# Patient Record
Sex: Male | Born: 1960 | Race: Black or African American | Hispanic: No | Marital: Married | Smoking: Never smoker
Health system: Southern US, Community
[De-identification: ages and names within clinical notes are randomized; demographics above are authoritative.]

## PROBLEM LIST (undated history)

## (undated) DIAGNOSIS — K219 Gastro-esophageal reflux disease without esophagitis: Secondary | ICD-10-CM

## (undated) DIAGNOSIS — N200 Calculus of kidney: Secondary | ICD-10-CM

## (undated) DIAGNOSIS — G4730 Sleep apnea, unspecified: Secondary | ICD-10-CM

## (undated) DIAGNOSIS — I4891 Unspecified atrial fibrillation: Secondary | ICD-10-CM

## (undated) DIAGNOSIS — I10 Essential (primary) hypertension: Secondary | ICD-10-CM

## (undated) HISTORY — DX: Gastro-esophageal reflux disease without esophagitis: K21.9

## (undated) HISTORY — DX: Unspecified atrial fibrillation: I48.91

## (undated) HISTORY — PX: APPENDECTOMY: SHX54

## (undated) HISTORY — DX: Calculus of kidney: N20.0

## (undated) HISTORY — DX: Sleep apnea, unspecified: G47.30

---

## 2005-12-19 ENCOUNTER — Emergency Department (HOSPITAL_COMMUNITY): Admission: EM | Admit: 2005-12-19 | Discharge: 2005-12-19 | Payer: Self-pay | Admitting: Emergency Medicine

## 2008-04-28 ENCOUNTER — Ambulatory Visit: Payer: Self-pay | Admitting: Cardiology

## 2008-04-29 ENCOUNTER — Inpatient Hospital Stay (HOSPITAL_COMMUNITY): Admission: AD | Admit: 2008-04-29 | Discharge: 2008-05-01 | Payer: Self-pay | Admitting: Cardiology

## 2008-04-29 ENCOUNTER — Ambulatory Visit: Payer: Self-pay | Admitting: Internal Medicine

## 2010-12-07 NOTE — Discharge Summary (Signed)
[name], [name]                ACCOUNT NO.:  [id number]   MEDICAL RECORD NO.:  [id number]          PATIENT TYPE:  INP   LOCATION:  2035                         FACILITY:  [facility]   PHYSICIAN:  Joseph Fells. Excell Seltzer, Joseph Armstrong  DATE OF BIRTH:  [birth year]   DATE OF ADMISSION:  04/29/2008  DATE OF DISCHARGE:  05/01/2008                               DISCHARGE SUMMARY   DISCHARGE DIAGNOSES:  1. Chest pain an dyspnea, noncardiac in etiology, status post cardiac      catheterization showed normal coronaries with low normal ejection      fraction and high normal pulmonary artery pressures.  2. Morbid obesity.  3. Hypertension.  4. Dyslipidemia.   PROCEDURES THIS ADMISSION:  Cardiac catheterization.   HOSPITAL HISTORY:  [name] is a 50 year old gentleman with history of  hypertension and morbid obesity as well as obstructive sleep apnea, who  was initially seen at [facility] with complaints of palpitations,  progressive shortness of breath, and chest discomfort with abnormal  stress Myoview.  The patient was transferred to [facility] for further  evaluation, underwent cardiac catheterization on April 30, 2008,  results as stated above.  Dr. Excell Armstrong in to see the patient on day of  discharge.  BUN and creatinine 9 and 1.2, potassium 4.2, hematocrit  37.8, cath site stable at right groin.  The patient being discharged  home to follow up with Dr. Doyne Keel, his primary care physician.  Will  have a post cath check in our office in [place] on May 19, 2008, at  2:30.  He will need fasting lipid and liver in 4-6 weeks.   DISCHARGE MEDICATIONS:  At time of discharge, he is instructed to  continue his:  1. Avapro 300 mg daily.  2. Aspirin 325 daily.  3. Simvastatin 40 daily has been prescribed.  4. Carvedilol 3.125 mg b.i.d. has been prescribed.   The patient is given the post cardiac catheterization discharge  instructions.   DURATION OF DISCHARGE ENCOUNTER:  Greater than 30  minutes.      Joseph Armstrong, Joseph Armstrong      Joseph Fells. Excell Seltzer, Joseph Armstrong  Electronically Signed   MB/MEDQ  D:  05/01/2008  T:  05/01/2008  Job:  [id number]

## 2010-12-07 NOTE — Cardiovascular Report (Signed)
NAMEJOSHIAH, Joseph Armstrong                ACCOUNT NO.:  [id number]   MEDICAL RECORD NO.:  [id number]          PATIENT TYPE:  INP   LOCATION:  2035                         FACILITY:  [facility]   PHYSICIAN:  Bevelyn Buckles. Bensimhon, MDDATE OF BIRTH:  1960-08-24   DATE OF PROCEDURE:  04/30/2008  DATE OF DISCHARGE:                            CARDIAC CATHETERIZATION   PATIENT IDENTIFICATION:  Joseph Armstrong is a very pleasant 50 year old male  with history of morbid obesity, hypertension and obstructive sleep  apnea.  He was admitted to [facility] with chest tightness and  significant dyspnea.  He had a Myoview which showed an ejection fraction  of 40%.  No clear perfusion defects.  He was referred for  catheterization to evaluate his abnormal stress test.   PROCEDURES PERFORMED:  1. Right heart cath.  2. Left heart cath.  3. Left ventriculogram.  4. Selective coronary angiography.  5. Angio-Seal femoral artery closure (failed).   DESCRIPTION OF PROCEDURE:  The risks and indications were explained,  consent was signed and placed on the chart.  The right groin area was  prepped and draped in routine sterile fashion.  We had a significant  amount of difficulty obtaining vascular access due to the patient's  size.  We finally ended up using a long needle and were able to get into  the femoral artery.  We placed a 6-French arterial sheath there.  The  left heart cath was performed using standard catheters, including a JL-  4, JR-4 and an angled pigtail.  There are no apparent complications.  We  attempted to close the artery with an Angio-Seal closure device, but  this failed and we held manual pressure for 10-15 minutes with good  hemostasis.  We then turned our attention to the femoral vein and were  able to cannulate this once again with a long needle.  A 7-French venous  sheath was placed in the right femoral vein and a standard Swan-Ganz  catheter was used for the right heart  catheterization.   HEMODYNAMICS:  Central aortic pressure was 177/112 with mean of 136.  LV  pressure 188/80 with an EDP of 11.  There was no aortic stenosis.  Right  atrial pressure mean of 11, RV pressure 155/12 with an EDP of 11, PA  pressure 40/16 with a mean of 25.  Pulmonary capillary wedge pressure  was mean of 14.   Left main was normal.   The LAD was a long vessel coursing to the apex, gave off three  diagonals, was angiographically normal.   The left circumflex gave off a large ramus, a large OM, and a moderate-  sized OM-2, was angiographically normal.   The right coronary artery was a moderate sized dominant vessel that gave  off an RV branch, PDA and a posterolateral, was angiographically normal.   Left ventriculogram done in the RAO position showed an ejection fraction  of approximately 50% with no regional wall motion abnormalities.   ASSESSMENT:  1. Normal coronary arteries.  2. Low normal left ventricular ejection fraction of 50% with no      regional  wall motion abnormalities.  3. High normal pulmonary artery pressures.   PLAN/DISCUSSION:  [name]' chest pain is likely noncardiac.  I  suspect his dyspnea is most likely due to his body habitus.  He will be  able to be discharged home in the morning if his groin and blood counts  are stable.      Bevelyn Buckles. Bensimhon, MD  Electronically Signed     DRB/MEDQ  D:  04/30/2008  T:  04/30/2008  Job:  [id number]

## 2011-04-25 LAB — POCT I-STAT 3, VENOUS BLOOD GAS (G3P V)
Bicarbonate: 24.5 — ABNORMAL HIGH
O2 Saturation: 58
O2 Saturation: 61
TCO2: 24
TCO2: 26
pCO2, Ven: 45.9
pH, Ven: 7.335 — ABNORMAL HIGH
pO2, Ven: 33
pO2, Ven: 33

## 2011-04-25 LAB — COMPREHENSIVE METABOLIC PANEL
ALT: 25
AST: 21
GFR calc Af Amer: >60
Glucose, Bld: 106 — ABNORMAL HIGH
Potassium: 4.2
Total Bilirubin: 0.6

## 2011-04-25 LAB — POCT I-STAT 3, ART BLOOD GAS (G3+)
TCO2: 26
pCO2 arterial: 42.5
pH, Arterial: 7.373

## 2011-04-25 LAB — HEMOGLOBIN A1C
Hgb A1c MFr Bld: 6.2 — ABNORMAL HIGH
Mean Plasma Glucose: 131

## 2011-04-25 LAB — CBC
HCT: 37.8 — ABNORMAL LOW
Hemoglobin: 12.1 — ABNORMAL LOW
MCV: 79.1
Platelets: 194
RBC: 4.78
RDW: 16.8 — ABNORMAL HIGH
WBC: 7.6

## 2011-04-25 LAB — APTT: aPTT: 35

## 2014-02-16 ENCOUNTER — Encounter (HOSPITAL_COMMUNITY): Payer: Self-pay | Admitting: Emergency Medicine

## 2014-02-16 ENCOUNTER — Emergency Department (HOSPITAL_COMMUNITY)
Admission: EM | Admit: 2014-02-16 | Discharge: 2014-02-17 | Discharge status: Against Medical Advice | Payer: 59 | Attending: Emergency Medicine | Admitting: Emergency Medicine

## 2014-02-16 DIAGNOSIS — I10 Essential (primary) hypertension: Secondary | ICD-10-CM | POA: Insufficient documentation

## 2014-02-16 DIAGNOSIS — R079 Chest pain, unspecified: Secondary | ICD-10-CM

## 2014-02-16 DIAGNOSIS — E785 Hyperlipidemia, unspecified: Secondary | ICD-10-CM | POA: Insufficient documentation

## 2014-02-16 DIAGNOSIS — R072 Precordial pain: Secondary | ICD-10-CM | POA: Diagnosis present

## 2014-02-16 DIAGNOSIS — I169 Hypertensive crisis, unspecified: Secondary | ICD-10-CM

## 2014-02-16 DIAGNOSIS — R000 Tachycardia, unspecified: Secondary | ICD-10-CM | POA: Diagnosis not present

## 2014-02-16 HISTORY — DX: Essential (primary) hypertension: I10

## 2014-02-16 MED ORDER — DILTIAZEM LOAD VIA INFUSION
10.0000 mg | Freq: Once | INTRAVENOUS | Status: AC
  Administered 2014-02-17: 10 mg via INTRAVENOUS
  Filled 2014-02-16: qty 10

## 2014-02-16 MED ORDER — DILTIAZEM HCL 100 MG IV SOLR
5.0000 mg/h | INTRAVENOUS | Status: DC
  Administered 2014-02-17: 5 mg/h via INTRAVENOUS

## 2014-02-16 NOTE — ED Notes (Signed)
Per  EMS pt had alcoholic beverage with wife and begin having chest pain(discomfort) radiating into shoulders and throat; Pt denies SOB, n/v. Pt states tightness in chest and throat. Pt took 325 mg of Asprin at home. Pt has urinated 3 x in last 45 minutes, states not normal; Pt has hx of HTN, hyperlipidemia; Pt is obese; Pt abmultory and a&o x 4; Pt states he missed yesterday's dose of metoprolol. Pt currently in A-Flutter which is new onset. Md at bedside for assessment

## 2014-02-17 ENCOUNTER — Emergency Department (HOSPITAL_COMMUNITY): Payer: 59

## 2014-02-17 ENCOUNTER — Encounter (HOSPITAL_COMMUNITY): Payer: Self-pay | Admitting: Emergency Medicine

## 2014-02-17 LAB — CBC WITH DIFFERENTIAL/PLATELET
BASOS ABS: 0 10*3/uL (ref 0.0–0.1)
BASOS PCT: 0 % (ref 0–1)
EOS PCT: 4 % (ref 0–5)
Eosinophils Absolute: 0.3 10*3/uL (ref 0.0–0.7)
HEMATOCRIT: 45.1 % (ref 39.0–52.0)
HEMOGLOBIN: 14.3 g/dL (ref 13.0–17.0)
LYMPHS PCT: 21 % (ref 12–46)
Lymphs Abs: 1.5 10*3/uL (ref 0.7–4.0)
MCH: 25.6 pg — AB (ref 26.0–34.0)
MCHC: 31.7 g/dL (ref 30.0–36.0)
MCV: 80.7 fL (ref 78.0–100.0)
MONO ABS: 0.5 10*3/uL (ref 0.1–1.0)
Monocytes Relative: 6 % (ref 3–12)
NEUTROS ABS: 4.9 10*3/uL (ref 1.7–7.7)
NEUTROS PCT: 69 % (ref 43–77)
Platelets: 186 10*3/uL (ref 150–400)
RBC: 5.59 MIL/uL (ref 4.22–5.81)
RDW: 16.2 % — AB (ref 11.5–15.5)
WBC: 7.1 10*3/uL (ref 4.0–10.5)

## 2014-02-17 LAB — PRO B NATRIURETIC PEPTIDE: Pro B Natriuretic peptide (BNP): 310.2 pg/mL — ABNORMAL HIGH (ref 0–125)

## 2014-02-17 LAB — I-STAT CHEM 8, ED
BUN: 16 mg/dL (ref 6–23)
Calcium, Ion: 1.15 mmol/L (ref 1.12–1.23)
Chloride: 105 mEq/L (ref 96–112)
Creatinine, Ser: 1.3 mg/dL (ref 0.50–1.35)
Glucose, Bld: 117 mg/dL — ABNORMAL HIGH (ref 70–99)
HCT: 47 % (ref 39.0–52.0)
Hemoglobin: 16 g/dL (ref 13.0–17.0)
Potassium: 3.6 mEq/L — ABNORMAL LOW (ref 3.7–5.3)
SODIUM: 143 meq/L (ref 137–147)
TCO2: 25 mmol/L (ref 0–100)

## 2014-02-17 LAB — RAPID URINE DRUG SCREEN, HOSP PERFORMED
Amphetamines: NOT DETECTED
Barbiturates: NOT DETECTED
Benzodiazepines: NOT DETECTED
COCAINE: NOT DETECTED
Opiates: NOT DETECTED
Tetrahydrocannabinol: NOT DETECTED

## 2014-02-17 LAB — I-STAT TROPONIN, ED: Troponin i, poc: 0.01 ng/mL (ref 0.00–0.08)

## 2014-02-17 MED ORDER — ASPIRIN 81 MG PO CHEW
324.0000 mg | CHEWABLE_TABLET | Freq: Once | ORAL | Status: DC
  Filled 2014-02-17: qty 4

## 2014-02-17 MED ORDER — NITROGLYCERIN 2 % TD OINT
0.5000 [in_us] | TOPICAL_OINTMENT | Freq: Once | TRANSDERMAL | Status: DC
  Filled 2014-02-17: qty 1

## 2014-02-17 MED ORDER — HYDRALAZINE HCL 20 MG/ML IJ SOLN
5.0000 mg | Freq: Once | INTRAMUSCULAR | Status: AC
  Administered 2014-02-17: 5 mg via INTRAVENOUS
  Filled 2014-02-17: qty 1

## 2014-02-17 NOTE — ED Provider Notes (Signed)
CSN: [id number]     Arrival date & time 02/16/14  2334 History   First MD Initiated Contact with Patient 02/16/14 2339     Chief Complaint  Patient presents with  . Chest Pain     (Consider location/radiation/quality/duration/timing/severity/associated sxs/prior Treatment) Patient is a 53 y.o. male presenting with chest pain. The history is provided by the patient.  Chest Pain Pain location:  Substernal area Pain quality: tightness   Pain radiates to:  Neck Pain radiates to the back: no   Pain severity:  Severe Onset quality:  Sudden Timing:  Constant Progression:  Unchanged Chronicity:  New Context: at rest   Context comment:  Alcohol Relieved by:  Nothing Worsened by:  Nothing tried Ineffective treatments:  None tried Associated symptoms: no abdominal pain, no cough and no fever   Risk factors: hypertension   Risk factors: no aortic disease and no smoking     Past Medical History  Diagnosis Date  . Hypertension   . Hyperlipemia    History reviewed. No pertinent past surgical history. History reviewed. No pertinent family history. History  Substance Use Topics  . Smoking status: Never Smoker   . Smokeless tobacco: Never Used  . Alcohol Use: Yes    Review of Systems  Constitutional: Negative for fever.  Respiratory: Negative for cough and wheezing.   Cardiovascular: Positive for chest pain.  Gastrointestinal: Negative for abdominal pain.  All other systems reviewed and are negative.     Allergies  Review of patient's allergies indicates not on file.  Home Medications   Prior to Admission medications   Not on File   SpO2 98% Physical Exam  Constitutional: He is oriented to person, place, and time. He appears well-developed and well-nourished.  HENT:  Head: Normocephalic and atraumatic.  Mouth/Throat: Oropharynx is clear and moist.  Eyes: Conjunctivae are normal. Pupils are equal, round, and reactive to light.  Neck: Normal range of motion. Neck  supple.  Cardiovascular: Regular rhythm.  Tachycardia present.   Pulmonary/Chest: Effort normal and breath sounds normal. No respiratory distress. He has no wheezes. He has no rales.  Abdominal: Soft. Bowel sounds are normal. There is no tenderness. There is no rebound and no guarding.  Musculoskeletal: Normal range of motion. He exhibits no tenderness.  Neurological: He is alert and oriented to person, place, and time.  Skin: Skin is warm and dry. He is not diaphoretic.    ED Course  Procedures (including critical care time) Labs Review Labs Reviewed  CBC WITH DIFFERENTIAL  PRO B NATRIURETIC PEPTIDE  URINE RAPID DRUG SCREEN (HOSP PERFORMED)  I-STAT CHEM 8, ED  I-STAT TROPOININ, ED    Imaging Review No results found.   EKG Interpretation None      MDM   Final diagnoses:  None     Date: 02/17/2014  Rate: 146  Rhythm: sinus tachycardia  QRS Axis: normal  Intervals: normal  ST/T Wave abnormalities: nonspecific ST changes  Conduction Disutrbances:none  Narrative Interpretation:   Old EKG Reviewed: none available   Medications  diltiazem (CARDIZEM) 1 mg/mL load via infusion 10 mg (0 mg Intravenous Stopped 02/17/14 0015)    And  diltiazem (CARDIZEM) 100 mg in dextrose 5 % 100 mL infusion (0 mg/hr Intravenous Hold 02/17/14 0158)  aspirin chewable tablet 324 mg (324 mg Oral Not Given 02/17/14 0014)  nitroGLYCERIN (NITROGLYN) 2 % ointment 0.5 inch ( Topical Not Given 02/17/14 0129)  hydrALAZINE (APRESOLINE) injection 5 mg (5 mg Intravenous Given 02/17/14 0043)   MDM  Reviewed: nursing note and vitals Interpretation: labs, ECG and x-ray (NACPD) Total time providing critical care: 30-74 minutes. This excludes time spent performing separately reportable procedures and services. Consults: admitting MD  CRITICAL CARE Performed by: Carlisle Beers Total critical care time: 31 minutes Critical care time was exclusive of separately billable procedures and treating other  patients. Critical care was necessary to treat or prevent imminent or life-threatening deterioration. Critical care was time spent personally by me on the following activities: development of treatment plan with patient and/or surrogate as well as nursing, discussions with consultants, evaluation of patient's response to treatment, examination of patient, obtaining history from patient or surrogate, ordering and performing treatments and interventions, ordering and review of laboratory studies, ordering and review of radiographic studies, pulse oximetry and re-evaluation of patient's condition.   Carlisle Beers, MD 02/17/14 [phone]

## 2014-02-17 NOTE — ED Provider Notes (Signed)
After admitting patient has decided to leave AMA.  He has decision making capacity to refuse care.  Understands that the risks of leaving are but are not limited to: Death, MI, CVA, prolonged morbidity.  Patient understands these risks and signs AMA and is welcome to return at any time  Kori Goins Alfonso Patten, MD 02/17/14 [phone]

## 2014-02-17 NOTE — ED Notes (Signed)
Risks of Leaving AMA explained to the pt, informed to come back if his condition worsen.

## 2015-02-05 ENCOUNTER — Ambulatory Visit (INDEPENDENT_AMBULATORY_CARE_PROVIDER_SITE_OTHER): Payer: 59 | Admitting: Primary Care

## 2015-02-05 ENCOUNTER — Encounter: Payer: Self-pay | Admitting: *Deleted

## 2015-02-05 ENCOUNTER — Encounter: Payer: Self-pay | Admitting: Primary Care

## 2015-02-05 VITALS — BP 156/106 | HR 65 | Temp 98.3°F | Ht 70.0 in | Wt 383.8 lb

## 2015-02-05 DIAGNOSIS — I152 Hypertension secondary to endocrine disorders: Secondary | ICD-10-CM | POA: Insufficient documentation

## 2015-02-05 DIAGNOSIS — G4733 Obstructive sleep apnea (adult) (pediatric): Secondary | ICD-10-CM | POA: Insufficient documentation

## 2015-02-05 DIAGNOSIS — E538 Deficiency of other specified B group vitamins: Secondary | ICD-10-CM | POA: Insufficient documentation

## 2015-02-05 DIAGNOSIS — I10 Essential (primary) hypertension: Secondary | ICD-10-CM

## 2015-02-05 DIAGNOSIS — E1159 Type 2 diabetes mellitus with other circulatory complications: Secondary | ICD-10-CM | POA: Insufficient documentation

## 2015-02-05 LAB — VITAMIN B12: Vitamin B-12: 190 pg/mL — ABNORMAL LOW (ref 211–911)

## 2015-02-05 MED ORDER — VALSARTAN-HYDROCHLOROTHIAZIDE 320-25 MG PO TABS: 1.0000 | ORAL_TABLET | Freq: Every day | ORAL | Status: DC

## 2015-02-05 MED ORDER — METOPROLOL TARTRATE 50 MG PO TABS: 50.0000 mg | ORAL_TABLET | Freq: Two times a day (BID) | ORAL | Status: DC

## 2015-02-05 NOTE — Progress Notes (Signed)
Subjective:    Patient ID: Joseph Armstrong, male    DOB: July 10, 1961, 54 y.o.   MRN: [id number]  HPI  Joseph Armstrong is a 54 year old male who presents today to establish care and discuss the problems mentioned below. Will obtain old records.  1) Hypertension: Managed on valsartan-HCTZ 320/25 mg and lopressor 50 mg tablets. Diagnosed with hypertension 25 years ago. He checks his blood pressure once every month. He's been out of his medication for the past 30 days due to his previous PCP retiring. Denies headaches, chest pain, shortness of breath.   2) Vitamin B12 def: He's been taking the cyanocobalamin injections for about 8 months. His levels were checked about 3 months ago and does not recall the latest reading.  3) OSA: Diagnosed 5-6 years ago and sleeps with CPAP. He is due for re-calibration.  4) Obesity: Breakfast: eggs, [place] bacon, orange juice. Lunch: chicken and fish (fried, baked). Dinner: chicken and fish (fried and baked) greens, cabbage, corn. Drinks mostly water, some kool-aid. Limited sodas and sweet tea. He does not currently exercise.  Review of Systems  HENT: Negative for rhinorrhea.   Respiratory: Negative for cough and shortness of breath.   Cardiovascular: Negative for chest pain.  Gastrointestinal: Negative for diarrhea and constipation.  Genitourinary: Negative for difficulty urinating.  Musculoskeletal: Negative for myalgias and arthralgias.  Skin: Negative for rash.  Neurological: Negative for dizziness and headaches.  Psychiatric/Behavioral:       Denies concerns for anxiety or depression       Past Medical History  Diagnosis Date  . Hypertension   . Kidney stones     History   Social History  . Marital Status: Married    Spouse Name: N/A  . Number of Children: N/A  . Years of Education: N/A   Occupational History  . Not on file.   Social History Main Topics  . Smoking status: Never Smoker   . Smokeless tobacco: Never Used  . Alcohol Use: 0.0  oz/week    0 Standard drinks or equivalent per week  . Drug Use: No  . Sexual Activity: Not on file   Other Topics Concern  . Not on file   Social History Narrative   Married.    2 children.   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     Past Surgical History  Procedure Laterality Date  . Appendectomy      Family History  Problem Relation Age of Onset  . Stroke Father   . Hypertension Father   . Hypertension Mother   . Diabetes Maternal Grandfather     No Known Allergies  Current Outpatient Prescriptions on File Prior to Visit  Medication Sig Dispense Refill  . Cyanocobalamin (VITAMIN B-12 IJ) Inject 1 Applicatorful as directed every 30 (thirty) days.     No current facility-administered medications on file prior to visit.    BP 156/106 mmHg  Pulse 65  Temp(Src) 98.3 F (36.8 C) (Oral)  Ht 5\' 10"  (1.778 m)  Wt 383 lb 12.8 oz (174.091 kg)  BMI 55.07 kg/m2  SpO2 98%    Objective:   Physical Exam  Constitutional: He is oriented to person, place, and time. He appears well-nourished.  HENT:  Head: Normocephalic.  Neck: Neck supple.  Cardiovascular: Normal rate and regular rhythm.   Pulmonary/Chest: Effort normal and breath sounds normal.  Neurological: He is alert and oriented to person, place, and time.  Skin: Skin is warm and dry.  Psychiatric: He has a normal mood and affect.          Assessment & Plan:

## 2015-02-05 NOTE — Assessment & Plan Note (Signed)
Diagnosed 25 years ago. Managed on diovan hct and metoprolol for years. He's been out for the past 30 days and is hypertensive in office. Refills provided. Will follow up in 1 month.

## 2015-02-05 NOTE — Patient Instructions (Addendum)
Refills have been provided for your blood pressure medication.  Complete lab work prior to leaving today. I will notify you of your results.  Please schedule a physical with me in the next 4 weeks. You will also schedule a lab only appointment one week prior. We will discuss your lab results during your physical.  You will be contacted regarding your referral to Pulmonology for re-calibration of your CPAP.  Please let us know if you have not heard back within one week.   It was a pleasure to meet you today! Please don't hesitate to call me with any questions. Welcome to [facility]!

## 2015-02-05 NOTE — Assessment & Plan Note (Signed)
On vitamin B12 injections for past 8 months. Unsure of recent levels. Lab today for levels. If remains low then will resume.

## 2015-02-05 NOTE — Assessment & Plan Note (Signed)
Wears Cpap. Due for re-calibration. Referral made to pulmonology.

## 2015-02-11 ENCOUNTER — Encounter: Payer: Self-pay | Admitting: *Deleted

## 2015-02-25 ENCOUNTER — Other Ambulatory Visit: Payer: Self-pay | Admitting: Primary Care

## 2015-02-25 DIAGNOSIS — Z0000 Encounter for general adult medical examination without abnormal findings: Secondary | ICD-10-CM

## 2015-03-02 ENCOUNTER — Other Ambulatory Visit (INDEPENDENT_AMBULATORY_CARE_PROVIDER_SITE_OTHER): Payer: 59

## 2015-03-02 DIAGNOSIS — Z125 Encounter for screening for malignant neoplasm of prostate: Secondary | ICD-10-CM | POA: Diagnosis not present

## 2015-03-02 DIAGNOSIS — Z0000 Encounter for general adult medical examination without abnormal findings: Secondary | ICD-10-CM

## 2015-03-02 DIAGNOSIS — I10 Essential (primary) hypertension: Secondary | ICD-10-CM | POA: Diagnosis not present

## 2015-03-02 LAB — COMPREHENSIVE METABOLIC PANEL
ALT: 16 U/L (ref 0–53)
AST: 16 U/L (ref 0–37)
Albumin: 4.1 g/dL (ref 3.5–5.2)
Alkaline Phosphatase: 51 U/L (ref 39–117)
BUN: 15 mg/dL (ref 6–23)
CALCIUM: 9.5 mg/dL (ref 8.4–10.5)
CO2: 29 mEq/L (ref 19–32)
CREATININE: 1.26 mg/dL (ref 0.40–1.50)
Chloride: 103 mEq/L (ref 96–112)
GFR: 76.56 mL/min (ref >60.00)
GLUCOSE: 114 mg/dL — AB (ref 70–99)
Potassium: 4.7 mEq/L (ref 3.5–5.1)
Sodium: 139 mEq/L (ref 135–145)
Total Bilirubin: 0.4 mg/dL (ref 0.2–1.2)
Total Protein: 7.4 g/dL (ref 6.0–8.3)

## 2015-03-02 LAB — LIPID PANEL
Cholesterol: 171 mg/dL (ref 0–200)
HDL: 43.5 mg/dL (ref >39.00)
LDL CALC: 115 mg/dL — AB (ref 0–99)
NonHDL: 127.57
Total CHOL/HDL Ratio: 4
Triglycerides: 64 mg/dL (ref 0.0–149.0)
VLDL: 12.8 mg/dL (ref 0.0–40.0)

## 2015-03-02 LAB — CBC
HCT: 44.7 % (ref 39.0–52.0)
HEMOGLOBIN: 14.4 g/dL (ref 13.0–17.0)
MCHC: 32.2 g/dL (ref 30.0–36.0)
MCV: 79.7 fl (ref 78.0–100.0)
Platelets: 206 10*3/uL (ref 150.0–400.0)
RBC: 5.61 Mil/uL (ref 4.22–5.81)
RDW: 16.9 % — ABNORMAL HIGH (ref 11.5–15.5)
WBC: 7.2 10*3/uL (ref 4.0–10.5)

## 2015-03-02 LAB — HEMOGLOBIN A1C: Hgb A1c MFr Bld: 6 % (ref 4.6–6.5)

## 2015-03-02 LAB — PSA: PSA: 0.46 ng/mL (ref 0.10–4.00)

## 2015-03-09 ENCOUNTER — Encounter: Payer: 59 | Admitting: Primary Care

## 2015-03-10 ENCOUNTER — Encounter: Payer: Self-pay | Admitting: Primary Care

## 2015-03-10 ENCOUNTER — Ambulatory Visit (INDEPENDENT_AMBULATORY_CARE_PROVIDER_SITE_OTHER): Payer: 59 | Admitting: Primary Care

## 2015-03-10 VITALS — Ht 70.0 in | Wt 380.8 lb

## 2015-03-10 DIAGNOSIS — Z23 Encounter for immunization: Secondary | ICD-10-CM

## 2015-03-10 DIAGNOSIS — I10 Essential (primary) hypertension: Secondary | ICD-10-CM | POA: Diagnosis not present

## 2015-03-10 DIAGNOSIS — Z1211 Encounter for screening for malignant neoplasm of colon: Secondary | ICD-10-CM

## 2015-03-10 DIAGNOSIS — Z0000 Encounter for general adult medical examination without abnormal findings: Secondary | ICD-10-CM | POA: Diagnosis not present

## 2015-03-10 DIAGNOSIS — E538 Deficiency of other specified B group vitamins: Secondary | ICD-10-CM | POA: Diagnosis not present

## 2015-03-10 DIAGNOSIS — I4891 Unspecified atrial fibrillation: Secondary | ICD-10-CM | POA: Diagnosis not present

## 2015-03-10 DIAGNOSIS — Z6841 Body Mass Index (BMI) 40.0 and over, adult: Secondary | ICD-10-CM

## 2015-03-10 MED ORDER — LOSARTAN POTASSIUM-HCTZ 100-25 MG PO TABS: 1.0000 | ORAL_TABLET | Freq: Every day | ORAL | Status: DC

## 2015-03-10 MED ORDER — CYANOCOBALAMIN 1000 MCG/ML IJ SOLN
1000.0000 ug | Freq: Once | INTRAMUSCULAR | Status: AC
  Administered 2015-03-10: 1000 ug via INTRAMUSCULAR

## 2015-03-10 NOTE — Assessment & Plan Note (Addendum)
Today he endorses history of after I noted an irregularity during exam. Rate regularly irregular, almost representing PVC's. ECG with normal sinus rhythm at a rate of 60. Asymptomatic during exam, will continue to monitor.

## 2015-03-10 NOTE — Progress Notes (Signed)
Pre visit review using our clinic review tool, if applicable. No additional management support is needed unless otherwise documented below in the visit note. 

## 2015-03-10 NOTE — Progress Notes (Signed)
Subjective:    Patient ID: Joseph Armstrong, male    DOB: Feb 28, 1961, 54 y.o.   MRN: [id number]  HPI  Joseph Armstrong is a 54 year old male who presents today for complete physical.  Immunizations: -Tetanus: Has not completed in over 10 years -Influenza: Did not receive last season   Diet:  Breakfast: [place] bacon, eggs, orange juice Lunch: Fried and baked fish and chicken Dinner: Fried and baked fish and chicken, vegetables, rice Desserts: Does not eat Beverages: Water  Exercise: He does not currently exercise Eye exam: Completed 2 months ago, was prescribed glasses Dental exam: Has not completed in over several years Colonoscopy: Has never completed.  Wt Readings from Last 3 Encounters:  03/10/15 380 lb 12.8 oz (172.73 kg)  02/05/15 383 lb 12.8 oz (174.091 kg)  02/17/14 391 lb 1 oz (177.385 kg)   2) Essential hypertension: He is managed on metoprolol 50 mg BID and valsartan-hctz 320/25 mg. BP today 146/92, but has not had his medication. He would like to change his valsartan-hctz to another medication as he has difficulty swallowing his large pills.   BP Readings from Last 3 Encounters:  02/05/15 156/106  02/17/14 151/64     Review of Systems  Constitutional: Negative for unexpected weight change.  HENT: Negative for rhinorrhea.   Respiratory: Negative for cough and shortness of breath.   Cardiovascular: Negative for chest pain.  Gastrointestinal: Negative for diarrhea, constipation and blood in stool.  Genitourinary: Negative for difficulty urinating.  Musculoskeletal: Negative for myalgias and arthralgias.  Skin: Negative for rash.  Neurological: Negative for dizziness, numbness and headaches.  Psychiatric/Behavioral:       Denies concerns for anxiety or depression       Past Medical History  Diagnosis Date  . Hypertension   . Kidney stones   . Atrial fibrillation     Social History   Social History  . Marital Status: Married    Spouse Name: N/A  . Number  of Children: N/A  . Years of Education: N/A   Occupational History  . Not on file.   Social History Main Topics  . Smoking status: Never Smoker   . Smokeless tobacco: Never Used  . Alcohol Use: 0.0 oz/week    0 Standard drinks or equivalent per week  . Drug Use: No  . Sexual Activity: Not on file   Other Topics Concern  . Not on file   Social History Narrative   Married.    2 children.   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     Past Surgical History  Procedure Laterality Date  . Appendectomy      Family History  Problem Relation Age of Onset  . Stroke Father   . Hypertension Father   . Hypertension Mother   . Diabetes Maternal Grandfather     No Known Allergies  Current Outpatient Prescriptions on File Prior to Visit  Medication Sig Dispense Refill  . metoprolol (LOPRESSOR) 50 MG tablet Take 1 tablet (50 mg total) by mouth 2 (two) times daily. 60 tablet 5  . valsartan-hydrochlorothiazide (DIOVAN-HCT) 320-25 MG per tablet Take 1 tablet by mouth daily. 30 tablet 5  . Cyanocobalamin (VITAMIN B-12 IJ) Inject 1 Applicatorful as directed every 30 (thirty) days.     No current facility-administered medications on file prior to visit.    Ht 5\' 10"  (1.778 m)  Wt 380 lb 12.8 oz (172.73 kg)  BMI 54.64  kg/m2    Objective:   Physical Exam  Constitutional: He is oriented to person, place, and time. He appears well-nourished.  HENT:  Nose: Nose normal.  Mouth/Throat: Oropharynx is clear and moist.  Eyes: Conjunctivae and EOM are normal. Pupils are equal, round, and reactive to light.  Neck: Neck supple.  Cardiovascular: Normal rate.   Noted irregular rhythm upon exam. Moreso like PVC's as this was regularly irregular  Pulmonary/Chest: Effort normal and breath sounds normal.  Abdominal: Soft. Bowel sounds are normal. There is no tenderness.  Musculoskeletal: Normal range of motion.  Lymphadenopathy:    He has  no cervical adenopathy.  Neurological: He is alert and oriented to person, place, and time. He has normal reflexes. No cranial nerve deficit.  Skin: Skin is warm and dry.  Psychiatric: He has a normal mood and affect.          Assessment & Plan:

## 2015-03-10 NOTE — Assessment & Plan Note (Signed)
Discussed the importance of weight loss on overall health. Information provided regarding a healthy diet and well as exercise goals. Will continue to monitor.

## 2015-03-10 NOTE — Assessment & Plan Note (Addendum)
Tdap provided today. Referral made for colonoscopy. Irregular heart beat noted upon exam, more so representing PVC's than a-fib; otherwise exam unremarkable. Discussed the importance of healthy diet and exercise to prevent further co-morbidities. Will repeat A1C in 3 months as it is borderline. Will continue to watch cholesterol.

## 2015-03-10 NOTE — Assessment & Plan Note (Signed)
Elevated in office today, has not had meds today. Will switch from diovan to hyzaar as the diovan tablets are too large for him to swallow. Will continue to watch BP

## 2015-03-10 NOTE — Assessment & Plan Note (Signed)
Injection provided today.

## 2015-03-10 NOTE — Patient Instructions (Addendum)
Start losartan-hydrochlorothiazide (Hyzaar) 100mg /25mg  for blood pressure. Take 1 tablet by mouth once daily.  Stop taking the valsartan-hydrochlorothiazide.  You will be contacted regarding your referral to GI for your colonoscopy.  Please let us know if you have not heard back within one week.   You are borderline diabetic. It is important that you improve your diet. Please limit carbohydrates in the form of white bread, rice, pasta, cakes, cookies, sugary drinks, etc. Increase your consumption of fresh fruits and vegetables. Be sure to drink plenty of water daily.  Your ECG (picture of your heart) looks good. We will continue to watch your heart.  Follow up in 3 months for repeat of your diabetes test.  It was a pleasure to see you today!  Diabetes Mellitus and Food It is important for you to manage your blood sugar (glucose) level. Your blood glucose level can be greatly affected by what you eat. Eating healthier foods in the appropriate amounts throughout the day at about the same time each day will help you control your blood glucose level. It can also help slow or prevent worsening of your diabetes mellitus. Healthy eating may even help you improve the level of your blood pressure and reach or maintain a healthy weight.  HOW CAN FOOD AFFECT ME? Carbohydrates Carbohydrates affect your blood glucose level more than any other type of food. Your dietitian will help you determine how many carbohydrates to eat at each meal and teach you how to count carbohydrates. Counting carbohydrates is important to keep your blood glucose at a healthy level, especially if you are using insulin or taking certain medicines for diabetes mellitus. Alcohol Alcohol can cause sudden decreases in blood glucose (hypoglycemia), especially if you use insulin or take certain medicines for diabetes mellitus. Hypoglycemia can be a life-threatening condition. Symptoms of hypoglycemia (sleepiness, dizziness, and  disorientation) are similar to symptoms of having too much alcohol.  If your health care provider has given you approval to drink alcohol, do so in moderation and use the following guidelines:  Women should not have more than one drink per day, and men should not have more than two drinks per day. One drink is equal to:  12 oz of beer.  5 oz of wine.  1 oz of hard liquor.  Do not drink on an empty stomach.  Keep yourself hydrated. Have water, diet soda, or unsweetened iced tea.  Regular soda, juice, and other mixers might contain a lot of carbohydrates and should be counted. WHAT FOODS ARE NOT RECOMMENDED? As you make food choices, it is important to remember that all foods are not the same. Some foods have fewer nutrients per serving than other foods, even though they might have the same number of calories or carbohydrates. It is difficult to get your body what it needs when you eat foods with fewer nutrients. Examples of foods that you should avoid that are high in calories and carbohydrates but low in nutrients include:  Trans fats (most processed foods list trans fats on the Nutrition Facts label).  Regular soda.  Juice.  Candy.  Sweets, such as cake, pie, doughnuts, and cookies.  Fried foods. WHAT FOODS CAN I EAT? Have nutrient-rich foods, which will nourish your body and keep you healthy. The food you should eat also will depend on several factors, including:  The calories you need.  The medicines you take.  Your weight.  Your blood glucose level.  Your blood pressure level.  Your cholesterol level. You also  should eat a variety of foods, including:  Protein, such as meat, poultry, fish, tofu, nuts, and seeds (lean animal proteins are best).  Fruits.  Vegetables.  Dairy products, such as milk, cheese, and yogurt (low fat is best).  Breads, grains, pasta, cereal, rice, and beans.  Fats such as olive oil, trans fat-free margarine, canola oil, avocado, and  olives. DOES EVERYONE WITH DIABETES MELLITUS HAVE THE SAME MEAL PLAN? Because every person with diabetes mellitus is different, there is not one meal plan that works for everyone. It is very important that you meet with a dietitian who will help you create a meal plan that is just right for you. Document Released: 04/07/2005 Document Revised: 07/16/2013 Document Reviewed: 06/07/2013 [facility] Patient Information 2015 [place], [place]. This information is not intended to replace advice given to you by your health care provider. Make sure you discuss any questions you have with your health care provider.

## 2015-04-15 ENCOUNTER — Ambulatory Visit: Payer: 59

## 2015-05-06 ENCOUNTER — Telehealth: Payer: Self-pay

## 2015-05-06 NOTE — Telephone Encounter (Signed)
BMI >50.  Can pt be a direct hospital case or do you want an OV first?  Thank you, Angela/PV

## 2015-05-06 NOTE — Telephone Encounter (Signed)
Needs [facility] appt for BMI >50  Thank you, Angela/PV  Comes in for PV on Monday 05/11/15

## 2015-05-07 ENCOUNTER — Other Ambulatory Visit: Payer: Self-pay

## 2015-05-07 DIAGNOSIS — Z1211 Encounter for screening for malignant neoplasm of colon: Secondary | ICD-10-CM

## 2015-05-07 NOTE — Telephone Encounter (Signed)
Thank you for catching that. I have left him a message with information. He is now scheduled for 06/08/15 at [facility] at 9:30 am. He should arrive at 8:00 am.

## 2015-05-08 ENCOUNTER — Other Ambulatory Visit: Payer: Self-pay | Admitting: *Deleted

## 2015-05-08 DIAGNOSIS — Z1211 Encounter for screening for malignant neoplasm of colon: Secondary | ICD-10-CM

## 2015-05-08 MED ORDER — NA SULFATE-K SULFATE-MG SULF 17.5-3.13-1.6 GM/177ML PO SOLN: 1.0000 | Freq: Once | ORAL | Status: AC

## 2015-05-08 NOTE — Telephone Encounter (Signed)
Patient had pre visit today and colonoscopy scheduled at [facility] on 06-08-15 at 9:30am. Patient aware of apt and appointment details. Patient also needed appointment scheduled with his PCP regarding his blood pressure. Called over to patients  PCP and appointment made for him on 05-11-15 at 1:00pm to evaluate his blood pressure. Patient is aware and has no question or concerns at this time.

## 2015-05-08 NOTE — Telephone Encounter (Signed)
Ok to schedule as direct in the hospital, please request Primary care office to bring him in for BP check and optimize it if elevated before colonoscopy. Thank you

## 2015-05-08 NOTE — Telephone Encounter (Signed)
Please schedule at hospital and communicate with pt regarding MD's last note.  Thanks [name].  Joseph Armstrong/PV

## 2015-05-11 ENCOUNTER — Institutional Professional Consult (permissible substitution): Payer: 59 | Admitting: Pulmonary Disease

## 2015-05-11 ENCOUNTER — Telehealth: Payer: Self-pay | Admitting: Primary Care

## 2015-05-11 ENCOUNTER — Ambulatory Visit: Payer: 59 | Admitting: Primary Care

## 2015-05-11 ENCOUNTER — Encounter: Payer: Self-pay | Admitting: Gastroenterology

## 2015-05-11 NOTE — Telephone Encounter (Signed)
Pt did not come in for their appt today for acute visit. Please let me know if pt needs to be contacted immediately for follow up or no follow up needed. Best phone number to contact pt is [phone].

## 2015-05-11 NOTE — Telephone Encounter (Signed)
No immediate follow up required at this time. Thanks.

## 2015-05-12 ENCOUNTER — Ambulatory Visit: Payer: 59 | Admitting: Primary Care

## 2015-05-18 ENCOUNTER — Ambulatory Visit (AMBULATORY_SURGERY_CENTER): Payer: Self-pay | Admitting: *Deleted

## 2015-05-18 VITALS — Ht 71.0 in | Wt 384.2 lb

## 2015-05-18 DIAGNOSIS — Z1211 Encounter for screening for malignant neoplasm of colon: Secondary | ICD-10-CM

## 2015-05-18 NOTE — Progress Notes (Signed)
No egg or soy allergy No issues with past sedation No diet pills No home 02 use  emmi video to e mail Samples of this drug were given to the patient, quantity 1, Lot Number [id number] exp 9-18 suprep. Pt has [facility] compass that will not cover suprep. Notified [facility] he got sample, doesn't need script.

## 2015-05-20 ENCOUNTER — Ambulatory Visit (INDEPENDENT_AMBULATORY_CARE_PROVIDER_SITE_OTHER): Payer: 59 | Admitting: *Deleted

## 2015-05-20 DIAGNOSIS — E538 Deficiency of other specified B group vitamins: Secondary | ICD-10-CM

## 2015-05-20 DIAGNOSIS — Z23 Encounter for immunization: Secondary | ICD-10-CM | POA: Diagnosis not present

## 2015-05-20 MED ORDER — CYANOCOBALAMIN 1000 MCG/ML IJ SOLN
1000.0000 ug | Freq: Once | INTRAMUSCULAR | Status: AC
  Administered 2015-05-20: 1000 ug via INTRAMUSCULAR

## 2015-05-25 ENCOUNTER — Encounter: Payer: 59 | Admitting: Gastroenterology

## 2015-06-08 ENCOUNTER — Ambulatory Visit (HOSPITAL_COMMUNITY): Payer: 59 | Admitting: Anesthesiology

## 2015-06-08 ENCOUNTER — Ambulatory Visit (HOSPITAL_COMMUNITY)
Admission: RE | Admit: 2015-06-08 | Discharge: 2015-06-08 | Disposition: A | Discharge status: Home / Self Care | Payer: 59 | Source: Ambulatory Visit | Attending: Gastroenterology | Admitting: Gastroenterology

## 2015-06-08 ENCOUNTER — Encounter (HOSPITAL_COMMUNITY): Payer: Self-pay | Admitting: Anesthesiology

## 2015-06-08 ENCOUNTER — Encounter (HOSPITAL_COMMUNITY)
Admission: RE | Disposition: A | Discharge status: Home / Self Care | Payer: Self-pay | Source: Ambulatory Visit | Attending: Gastroenterology

## 2015-06-08 DIAGNOSIS — I4891 Unspecified atrial fibrillation: Secondary | ICD-10-CM | POA: Insufficient documentation

## 2015-06-08 DIAGNOSIS — Z7982 Long term (current) use of aspirin: Secondary | ICD-10-CM | POA: Insufficient documentation

## 2015-06-08 DIAGNOSIS — G4730 Sleep apnea, unspecified: Secondary | ICD-10-CM | POA: Diagnosis not present

## 2015-06-08 DIAGNOSIS — D125 Benign neoplasm of sigmoid colon: Secondary | ICD-10-CM

## 2015-06-08 DIAGNOSIS — D128 Benign neoplasm of rectum: Secondary | ICD-10-CM | POA: Insufficient documentation

## 2015-06-08 DIAGNOSIS — Z1211 Encounter for screening for malignant neoplasm of colon: Secondary | ICD-10-CM | POA: Diagnosis present

## 2015-06-08 DIAGNOSIS — D124 Benign neoplasm of descending colon: Secondary | ICD-10-CM

## 2015-06-08 DIAGNOSIS — Z6841 Body Mass Index (BMI) 40.0 and over, adult: Secondary | ICD-10-CM | POA: Diagnosis not present

## 2015-06-08 DIAGNOSIS — D120 Benign neoplasm of cecum: Secondary | ICD-10-CM | POA: Diagnosis not present

## 2015-06-08 DIAGNOSIS — Z79899 Other long term (current) drug therapy: Secondary | ICD-10-CM | POA: Diagnosis not present

## 2015-06-08 DIAGNOSIS — I10 Essential (primary) hypertension: Secondary | ICD-10-CM | POA: Diagnosis not present

## 2015-06-08 DIAGNOSIS — K648 Other hemorrhoids: Secondary | ICD-10-CM | POA: Insufficient documentation

## 2015-06-08 DIAGNOSIS — K219 Gastro-esophageal reflux disease without esophagitis: Secondary | ICD-10-CM | POA: Diagnosis not present

## 2015-06-08 HISTORY — PX: COLONOSCOPY: SHX5424

## 2015-06-08 SURGERY — COLONOSCOPY
Anesthesia: Monitor Anesthesia Care

## 2015-06-08 MED ORDER — PROPOFOL 10 MG/ML IV BOLUS
INTRAVENOUS | Status: AC
Start: 2015-06-08 — End: 2015-06-08
  Filled 2015-06-08: qty 20

## 2015-06-08 MED ORDER — OXYCODONE HCL 5 MG PO TABS: 5.0000 mg | ORAL_TABLET | Freq: Once | ORAL | Status: DC | PRN

## 2015-06-08 MED ORDER — PROPOFOL 10 MG/ML IV BOLUS
INTRAVENOUS | Status: AC
  Filled 2015-06-08: qty 20

## 2015-06-08 MED ORDER — PROPOFOL 500 MG/50ML IV EMUL
INTRAVENOUS | Status: DC | PRN
  Administered 2015-06-08: 75 ug/kg/min via INTRAVENOUS

## 2015-06-08 MED ORDER — PROPOFOL 10 MG/ML IV BOLUS
INTRAVENOUS | Status: DC | PRN
  Administered 2015-06-08: 10 mg via INTRAVENOUS
  Administered 2015-06-08: 50 mg via INTRAVENOUS

## 2015-06-08 MED ORDER — LACTATED RINGERS IV SOLN
INTRAVENOUS | Status: DC
  Administered 2015-06-08: 1000 mL via INTRAVENOUS

## 2015-06-08 MED ORDER — SODIUM CHLORIDE 0.9 % IV SOLN: INTRAVENOUS | Status: DC

## 2015-06-08 MED ORDER — ONDANSETRON HCL 4 MG/2ML IJ SOLN: 4.0000 mg | Freq: Four times a day (QID) | INTRAMUSCULAR | Status: DC | PRN

## 2015-06-08 MED ORDER — OXYCODONE HCL 5 MG/5ML PO SOLN: 5.0000 mg | Freq: Once | ORAL | Status: DC | PRN

## 2015-06-08 MED ORDER — FENTANYL CITRATE (PF) 100 MCG/2ML IJ SOLN: 25.0000 ug | INTRAMUSCULAR | Status: DC | PRN

## 2015-06-08 NOTE — Anesthesia Postprocedure Evaluation (Signed)
Anesthesia Post Note  Patient: Joseph Armstrong  Procedure(s) Performed: Procedure(s) (LRB): COLONOSCOPY (N/A)  Anesthesia type: MAC  Patient location: PACU  Post pain: Pain level controlled and Adequate analgesia  Post assessment: Post-op Vital signs reviewed, Patient's Cardiovascular Status Stable and Respiratory Function Stable  Last Vitals:  Filed Vitals:   06/08/15 1120  BP: 200/109  Pulse: 68  Temp:   Resp: 20    Post vital signs: Reviewed and stable  Level of consciousness: awake, alert  and oriented  Complications: No apparent anesthesia complications

## 2015-06-08 NOTE — Anesthesia Preprocedure Evaluation (Signed)
Anesthesia Evaluation  Patient identified by MRN, date of birth, ID band Patient awake    Reviewed: Allergy & Precautions, NPO status , Patient's Chart, lab work & pertinent test results  Airway Mallampati: II   Neck ROM: full    Dental   Pulmonary sleep apnea ,    breath sounds clear to auscultation       Cardiovascular hypertension,  Rhythm:regular Rate:Normal     Neuro/Psych    GI/Hepatic GERD  ,  Endo/Other  Morbid obesity  Renal/GU      Musculoskeletal   Abdominal   Peds  Hematology   Anesthesia Other Findings   Reproductive/Obstetrics                             Anesthesia Physical Anesthesia Plan  ASA: II  Anesthesia Plan: MAC   Post-op Pain Management:    Induction: Intravenous  Airway Management Planned: Simple Face Mask  Additional Equipment:   Intra-op Plan:   Post-operative Plan:   Informed Consent: I have reviewed the patients History and Physical, chart, labs and discussed the procedure including the risks, benefits and alternatives for the proposed anesthesia with the patient or authorized representative who has indicated his/her understanding and acceptance.     Plan Discussed with: CRNA, Anesthesiologist and Surgeon  Anesthesia Plan Comments:         Anesthesia Quick Evaluation

## 2015-06-08 NOTE — Op Note (Signed)
[facility] [place], [address]   COLONOSCOPY PROCEDURE REPORT  PATIENT: Joseph Armstrong, Joseph Armstrong  MR#: [id number] BIRTHDATE: [birth year] , 54  yrs. old GENDER: male ENDOSCOPIST: Harl Bowie, MD REFERRED [id number] Carlis Abbott, NP PROCEDURE DATE:  06/08/2015 PROCEDURE:   Colonoscopy, screening, Colonoscopy with cold biopsy polypectomy, and Colonoscopy with snare polypectomy First Screening Colonoscopy - Avg.  risk and is 50 yrs.  old or older Yes.  Prior Negative Screening - Now for repeat screening. N/A  History of Adenoma - Now for follow-up colonoscopy & has been > or = to 3 yrs.  N/A  Polyps removed today? Yes ASA CLASS:   Class III INDICATIONS:Screening for colonic neoplasia and Colorectal Neoplasm Risk Assessment for this procedure is average risk. MEDICATIONS: Monitored anesthesia care  DESCRIPTION OF PROCEDURE:   After the risks benefits and alternatives of the procedure were thoroughly explained, informed consent was obtained.  The digital rectal exam revealed no abnormalities of the rectum.   The Pentax Ped Colon [id number] endoscope was introduced through the anus and advanced to the cecum, which was identified by both the appendix and ileocecal valve. No adverse events experienced.   The quality of the prep was good.  The instrument was then slowly withdrawn as the colon was fully examined. Estimated blood loss is zero unless otherwise noted in this procedure report.  COLON FINDINGS: 72mm sessile polyp in cecum removed by cold snare. 67mm sessile polyp in descending colon removed by biopsy forceps. 26mm sessile polyp in sigmoid colon removed by hor snare.  2 flat polyp  35mm in size removed by cold snare.  64mm sessile polyp removed by hot snare.  All tissue retrieved.  Small non bleeding angioectasia noted in the descending colon. Small internal hemorrhoids.  Retroflexed views revealed internal hemorrhoids. The time to cecum = 9.2 Withdrawal time =  26.7   The scope was withdrawn and the procedure completed. COMPLICATIONS: There were no immediate complications.  ENDOSCOPIC IMPRESSION: 5 sessile polyps removed Small internal hemorrhoids  RECOMMENDATIONS: 1.  Await pathology results 2.  If the polyp(s) removed today are proven to be adenomatous (pre-cancerous) polyps, you will need a colonoscopy in 3 years. Otherwise you should continue to follow colorectal cancer screening guidelines for "routine risk" patients with a colonoscopy in 10 years.  You will receive a letter within 1-2 weeks with the results of your biopsy as well as final recommendations.  Please call my office if you have not received a letter after 3 weeks.  eSigned:  Harl Bowie, MD 06/08/2015 11:38 AM      PATIENT NAME:  Joseph Armstrong, Joseph Armstrong MR#: [id number]

## 2015-06-08 NOTE — Transfer of Care (Signed)
Immediate Anesthesia Transfer of Care Note  Patient: Joseph Armstrong  Procedure(s) Performed: Procedure(s) with comments: COLONOSCOPY (N/A) - BMI > 50  Patient Location: PACU  Anesthesia Type:MAC  Level of Consciousness:  sedated, patient cooperative and responds to stimulation  Airway & Oxygen Therapy:Patient Spontanous Breathing and Patient connected to face mask oxgen  Post-op Assessment:  Report given to PACU RN and Post -op Vital signs reviewed and stable  Post vital signs:  Reviewed and stable  Last Vitals:  Filed Vitals:   06/08/15 1114  BP: 203/97  Pulse: 71  Temp: 36.4 C  Resp: 15    Complications: No apparent anesthesia complications

## 2015-06-08 NOTE — H&P (Signed)
[facility] History and Physical   Primary Care Physician:  Sheral Flow, NP   Reason for Procedure:  Screening colonoscopy Plan:    The risks and benefits as well as alternatives of endoscopic procedure(s) have been discussed and reviewed. All questions answered. The patient agrees to proceed.     HPI: Joseph Armstrong is a 54 y.o. male with h/o HTN and OSA is here for screening colonoscopy. Denies any nausea, vomiting, abdominal pain, melena or bright red blood per rectum No family h/o colon cancer or polyps.   Past Medical History  Diagnosis Date  . Hypertension   . Kidney stones   . Atrial fibrillation ([place])   . Sleep apnea   . GERD (gastroesophageal reflux disease)     Past Surgical History  Procedure Laterality Date  . Appendectomy      Prior to Admission medications   Medication Sig Start Date End Date Taking? Authorizing Provider  aspirin EC 325 MG tablet Take 325 mg by mouth daily.   Yes Historical Provider, MD  Cyanocobalamin (VITAMIN B-12 IJ) Inject 1 Applicatorful as directed every 30 (thirty) days.   Yes Historical Provider, MD  losartan-hydrochlorothiazide (HYZAAR) 100-25 MG per tablet Take 1 tablet by mouth daily. 03/10/15  Yes Pleas Koch, NP  metoprolol (LOPRESSOR) 50 MG tablet Take 1 tablet (50 mg total) by mouth 2 (two) times daily. 02/05/15  Yes Pleas Koch, NP    Current Facility-Administered Medications  Medication Dose Route Frequency Provider Last Rate Last Dose  . 0.9 %  sodium chloride infusion   Intravenous Continuous Kavitha Nandigam V, MD      . fentaNYL (SUBLIMAZE) injection 25-50 mcg  25-50 mcg Intravenous Q5 min PRN Albertha Ghee, MD      . lactated ringers infusion   Intravenous Continuous Harl Bowie V, MD 125 mL/hr at 06/08/15 0933 1,000 mL at 06/08/15 0933  . ondansetron (ZOFRAN) injection 4 mg  4 mg Intravenous Q6H PRN Albertha Ghee, MD      . oxyCODONE (Oxy IR/ROXICODONE) immediate release tablet 5 mg   5 mg Oral Once PRN Albertha Ghee, MD       Or  . oxyCODONE (ROXICODONE) 5 MG/5ML solution 5 mg  5 mg Oral Once PRN Albertha Ghee, MD        Allergies as of 05/07/2015  . (No Known Allergies)    Family History  Problem Relation Age of Onset  . Stroke Father   . Hypertension Father   . Hypertension Mother   . Diabetes Maternal Grandfather   . Colon cancer Neg Hx     Social History   Social History  . Marital Status: Married    Spouse Name: N/A  . Number of Children: N/A  . Years of Education: N/A   Occupational History  . Not on file.   Social History Main Topics  . Smoking status: Never Smoker   . Smokeless tobacco: Never Used  . Alcohol Use: 0.0 oz/week    0 Standard drinks or equivalent per week     Comment: occasionally  . Drug Use: No  . Sexual Activity: Not on file   Other Topics Concern  . Not on file   Social History Narrative   Married.    2 children.   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     Review of Systems: Positive for OSA All other review of systems negative except as mentioned in the HPI.  Physical Exam: Vital signs in last 24 hours: Temp:  [99.1 F (37.3 C)] 99.1 F (37.3 C) (11/14 0916) Pulse Rate:  [75] 75 (11/14 0916) Resp:  [20] 20 (11/14 0916) BP: (211)/(11) 211/11 mmHg (11/14 0916) SpO2:  [98 %] 98 % (11/14 0916) Weight:  [384 lb (174.181 kg)] 384 lb (174.181 kg) (11/14 0916)   General:   Alert, morbidly obese Well-developed, well-nourished, pleasant and cooperative in NAD Lungs:  Clear throughout to auscultation.   Heart:  Regular rate and rhythm; no murmurs, clicks, rubs,  or gallops. Abdomen:  Soft, nontender and nondistended. Normal bowel sounds.   Neuro/Psych:  Alert and cooperative. Normal mood and affect. A and O x 3   @K .Denzil Magnuson, MD [facility] [phone] (pager) 06/08/2015 10:06 AM@

## 2015-06-08 NOTE — Discharge Instructions (Signed)
Colonoscopy, Care After These instructions give you information on caring for yourself after your procedure. Your doctor may also give you more specific instructions. Call your doctor if you have any problems or questions after your procedure. HOME CARE  Do not drive for 24 hours.  Do not sign important papers or use machinery for 24 hours.  You may shower.  You may go back to your usual activities, but go slower for the first 24 hours.  Take rest breaks often during the first 24 hours.  Walk around or use warm packs on your belly (abdomen) if you have belly cramping or gas.  Drink enough fluids to keep your pee (urine) clear or pale yellow.  Resume your normal diet. Avoid heavy or fried foods.  Avoid drinking alcohol for 24 hours or as told by your doctor.  Only take medicines as told by your doctor. If a tissue sample (biopsy) was taken during the procedure:   Do not take aspirin or blood thinners for 7 days, or as told by your doctor.  Do not drink alcohol for 7 days, or as told by your doctor.  Eat soft foods for the first 24 hours. GET HELP IF: You still have a small amount of blood in your poop (stool) 2-3 days after the procedure. GET HELP RIGHT AWAY IF:  You have more than a small amount of blood in your poop.  You see clumps of tissue (blood clots) in your poop.  Your belly is puffy (swollen).  You feel sick to your stomach (nauseous) or throw up (vomit).  You have a fever.  You have belly pain that gets worse and medicine does not help. MAKE SURE YOU:  Understand these instructions.  Will watch your condition.  Will get help right away if you are not doing well or get worse.   This information is not intended to replace advice given to you by your health care provider. Make sure you discuss any questions you have with your health care provider.   Document Released: 08/13/2010 Document Revised: 07/16/2013 Document Reviewed: 03/18/2013 Elsevier  Interactive Patient Education 2016 Else[employer] Inc[employer] Colon Polyps Polyps are lumps of extra tissue growing inside the body. Polyps can grow in the large intestine (colon). Most colon polyps are noncancerous (benign). However, some colon polyps can become cancerous over time. Polyps that are larger than a pea may be harmful. To be safe, caregivers remove and test all polyps. CAUSES  Polyps form when mutations in the genes cause your cells to grow and divide even though no more tissue is needed. RISK FACTORS There are a number of risk factors that can increase your chances of getting colon polyps. They include: Being older than 50 years. Family history of colon polyps or colon cancer. Long-term colon diseases, such as colitis or Crohn disease. Being overweight. Smoking. Being inactive. Drinking too much alcohol. SYMPTOMS  Most small polyps do not cause symptoms. If symptoms are present, they may include: Blood in the stool. The stool may look dark red or black. Constipation or diarrhea that lasts longer than 1 week. DIAGNOSIS People often do not know they have polyps until their caregiver finds them during a regular checkup. Your caregiver can use 4 tests to check for polyps: Digital rectal exam. The caregiver wears gloves and feels inside the rectum. This test would find polyps only in the rectum. Barium enema. The caregiver puts a liquid called barium into your rectum before taking X-rays of your colon. Barium makes your  colon look white. Polyps are dark, so they are easy to see in the X-ray pictures. Sigmoidoscopy. A thin, flexible tube (sigmoidoscope) is placed into your rectum. The sigmoidoscope has a light and tiny camera in it. The caregiver uses the sigmoidoscope to look at the last third of your colon. Colonoscopy. This test is like sigmoidoscopy, but the caregiver looks at the entire colon. This is the most common method for finding and removing polyps. TREATMENT  Any polyps will be  removed during a sigmoidoscopy or colonoscopy. The polyps are then tested for cancer. PREVENTION  To help lower your risk of getting more colon polyps: Eat plenty of fruits and vegetables. Avoid eating fatty foods. Do not smoke. Avoid drinking alcohol. Exercise every day. Lose weight if recommended by your caregiver. Eat plenty of calcium and folate. Foods that are rich in calcium include milk, cheese, and broccoli. Foods that are rich in folate include chickpeas, kidney beans, and spinach. HOME CARE INSTRUCTIONS Keep all follow-up appointments as directed by your caregiver. You may need periodic exams to check for polyps. SEEK MEDICAL CARE IF: You notice bleeding during a bowel movement.   This information is not intended to replace advice given to you by your health care provider. Make sure you discuss any questions you have with your health care provider.   Document Released: 04/06/2004 Document Revised: 08/01/2014 Document Reviewed: 09/20/2011 Elsevier Interactive Patient Education 2016 Elsevier Inc.  Colon Polyps Polyps are lumps of extra tissue growing inside the body. Polyps can grow in the large intestine (colon). Most colon polyps are noncancerous (benign). However, some colon polyps can become cancerous over time. Polyps that are larger than a pea may be harmful. To be safe, caregivers remove and test all polyps. CAUSES  Polyps form when mutations in the genes cause your cells to grow and divide even though no more tissue is needed. RISK FACTORS There are a number of risk factors that can increase your chances of getting colon polyps. They include: Being older than 50 years. Family history of colon polyps or colon cancer. Long-term colon diseases, such as colitis or Crohn disease. Being overweight. Smoking. Being inactive. Drinking too much alcohol. SYMPTOMS  Most small polyps do not cause symptoms. If symptoms are present, they may include: Blood in the stool. The stool  may look dark red or black. Constipation or diarrhea that lasts longer than 1 week. DIAGNOSIS People often do not know they have polyps until their caregiver finds them during a regular checkup. Your caregiver can use 4 tests to check for polyps: Digital rectal exam. The caregiver wears gloves and feels inside the rectum. This test would find polyps only in the rectum. Barium enema. The caregiver puts a liquid called barium into your rectum before taking X-rays of your colon. Barium makes your colon look white. Polyps are dark, so they are easy to see in the X-ray pictures. Sigmoidoscopy. A thin, flexible tube (sigmoidoscope) is placed into your rectum. The sigmoidoscope has a light and tiny camera in it. The caregiver uses the sigmoidoscope to look at the last third of your colon. Colonoscopy. This test is like sigmoidoscopy, but the caregiver looks at the entire colon. This is the most common method for finding and removing polyps. TREATMENT  Any polyps will be removed during a sigmoidoscopy or colonoscopy. The polyps are then tested for cancer. PREVENTION  To help lower your risk of getting more colon polyps: Eat plenty of fruits and vegetables. Avoid eating fatty foods. Do not  smoke. Avoid drinking alcohol. Exercise every day. Lose weight if recommended by your caregiver. Eat plenty of calcium and folate. Foods that are rich in calcium include milk, cheese, and broccoli. Foods that are rich in folate include chickpeas, kidney beans, and spinach. HOME CARE INSTRUCTIONS Keep all follow-up appointments as directed by your caregiver. You may need periodic exams to check for polyps. SEEK MEDICAL CARE IF: You notice bleeding during a bowel movement.   This information is not intended to replace advice given to you by your health care provider. Make sure you discuss any questions you have with your health care provider.   Document Released: 04/06/2004 Document Revised: 08/01/2014 Document  Reviewed: 09/20/2011 Elsevier Interactive Patient Education [employer].

## 2015-06-09 ENCOUNTER — Encounter (HOSPITAL_COMMUNITY): Payer: Self-pay | Admitting: Gastroenterology

## 2015-06-09 ENCOUNTER — Emergency Department (HOSPITAL_COMMUNITY)
Admission: EM | Admit: 2015-06-09 | Discharge: 2015-06-09 | Disposition: A | Discharge status: Home / Self Care | Payer: 59 | Attending: Emergency Medicine | Admitting: Emergency Medicine

## 2015-06-09 ENCOUNTER — Telehealth: Payer: Self-pay | Admitting: Primary Care

## 2015-06-09 DIAGNOSIS — Z8719 Personal history of other diseases of the digestive system: Secondary | ICD-10-CM | POA: Insufficient documentation

## 2015-06-09 DIAGNOSIS — Z7982 Long term (current) use of aspirin: Secondary | ICD-10-CM | POA: Diagnosis not present

## 2015-06-09 DIAGNOSIS — Z91199 Patient's noncompliance with other medical treatment and regimen due to unspecified reason: Secondary | ICD-10-CM

## 2015-06-09 DIAGNOSIS — Z79899 Other long term (current) drug therapy: Secondary | ICD-10-CM | POA: Diagnosis not present

## 2015-06-09 DIAGNOSIS — I4891 Unspecified atrial fibrillation: Secondary | ICD-10-CM | POA: Insufficient documentation

## 2015-06-09 DIAGNOSIS — Z8669 Personal history of other diseases of the nervous system and sense organs: Secondary | ICD-10-CM | POA: Insufficient documentation

## 2015-06-09 DIAGNOSIS — Z87442 Personal history of urinary calculi: Secondary | ICD-10-CM | POA: Diagnosis not present

## 2015-06-09 DIAGNOSIS — Z9119 Patient's noncompliance with other medical treatment and regimen: Secondary | ICD-10-CM | POA: Insufficient documentation

## 2015-06-09 DIAGNOSIS — R040 Epistaxis: Secondary | ICD-10-CM | POA: Diagnosis not present

## 2015-06-09 DIAGNOSIS — I10 Essential (primary) hypertension: Secondary | ICD-10-CM | POA: Diagnosis not present

## 2015-06-09 MED ORDER — ACETAMINOPHEN 325 MG PO TABS
650.0000 mg | ORAL_TABLET | Freq: Once | ORAL | Status: AC
  Administered 2015-06-09: 650 mg via ORAL
  Filled 2015-06-09: qty 2

## 2015-06-09 NOTE — ED Notes (Signed)
MD Knott at the bedside   

## 2015-06-09 NOTE — Discharge Instructions (Signed)
Hypertension °Hypertension, commonly called high blood pressure, is when the force of blood pumping through your arteries is too strong. Your arteries are the blood vessels that carry blood from your heart throughout your body. A blood pressure reading consists of a higher number over a lower number, such as 110/72. The higher number (systolic) is the pressure inside your arteries when your heart pumps. The lower number (diastolic) is the pressure inside your arteries when your heart relaxes. Ideally you want your blood pressure below 120/80. °Hypertension forces your heart to work harder to pump blood. Your arteries may become narrow or stiff. Having untreated or uncontrolled hypertension can cause heart attack, stroke, kidney disease, and other problems. °RISK FACTORS °Some risk factors for high blood pressure are controllable. Others are not.  °Risk factors you cannot control include:  °· Race. You may be at higher risk if you are African American. °· Age. Risk increases with age. °· Gender. Men are at higher risk than women before age 45 years. After age 65, women are at higher risk than men. °Risk factors you can control include: °· Not getting enough exercise or physical activity. °· Being overweight. °· Getting too much fat, sugar, calories, or salt in your diet. °· Drinking too much alcohol. °SIGNS AND SYMPTOMS °Hypertension does not usually cause signs or symptoms. Extremely high blood pressure (hypertensive crisis) may cause headache, anxiety, shortness of breath, and nosebleed. °DIAGNOSIS °To check if you have hypertension, your health care provider will measure your blood pressure while you are seated, with your arm held at the level of your heart. It should be measured at least twice using the same arm. Certain conditions can cause a difference in blood pressure between your right and left arms. A blood pressure reading that is higher than normal on one occasion does not mean that you need treatment. If  it is not clear whether you have high blood pressure, you may be asked to return on a different day to have your blood pressure checked again. Or, you may be asked to monitor your blood pressure at home for 1 or more weeks. °TREATMENT °Treating high blood pressure includes making lifestyle changes and possibly taking medicine. Living a healthy lifestyle can help lower high blood pressure. You may need to change some of your habits. °Lifestyle changes may include: °· Following the DASH diet. This diet is high in fruits, vegetables, and whole grains. It is low in salt, red meat, and added sugars. °· Keep your sodium intake below 2,300 mg per day. °· Getting at least 30-45 minutes of aerobic exercise at least 4 times per week. °· Losing weight if necessary. °· Not smoking. °· Limiting alcoholic beverages. °· Learning ways to reduce stress. °Your health care provider may prescribe medicine if lifestyle changes are not enough to get your blood pressure under control, and if one of the following is true: °· You are 18-59 years of age and your systolic blood pressure is above 140. °· You are 60 years of age or older, and your systolic blood pressure is above 150. °· Your diastolic blood pressure is above 90. °· You have diabetes, and your systolic blood pressure is over 140 or your diastolic blood pressure is over 90. °· You have kidney disease and your blood pressure is above 140/90. °· You have heart disease and your blood pressure is above 140/90. °Your personal target blood pressure may vary depending on your medical conditions, your age, and other factors. °HOME CARE INSTRUCTIONS °·   Have your blood pressure rechecked as directed by your health care provider.   °· Take medicines only as directed by your health care provider. Follow the directions carefully. Blood pressure medicines must be taken as prescribed. The medicine does not work as well when you skip doses. Skipping doses also puts you at risk for  problems. °· Do not smoke.   °· Monitor your blood pressure at home as directed by your health care provider.  °SEEK MEDICAL CARE IF:  °· You think you are having a reaction to medicines taken. °· You have recurrent headaches or feel dizzy. °· You have swelling in your ankles. °· You have trouble with your vision. °SEEK IMMEDIATE MEDICAL CARE IF: °· You develop a severe headache or confusion. °· You have unusual weakness, numbness, or feel faint. °· You have severe chest or abdominal pain. °· You vomit repeatedly. °· You have trouble breathing. °MAKE SURE YOU:  °· Understand these instructions. °· Will watch your condition. °· Will get help right away if you are not doing well or get worse. °  °This information is not intended to replace advice given to you by your health care provider. Make sure you discuss any questions you have with your health care provider. °  °Document Released: 07/11/2005 Document Revised: 11/25/2014 Document Reviewed: 05/03/2013 °Elsevier Interactive Patient Education ©2016 [employer] ° °Nosebleed °Nosebleeds are common. They are due to a crack in the inside lining of your nose (mucous membrane) or from a small blood vessel that starts to bleed. Nosebleeds can be caused by many conditions, such as injury, infections, dry mucous membranes or dry climate, medicines, nose picking, and home heating and cooling systems. Most nosebleeds come from blood vessels in the front of your nose. °HOME CARE INSTRUCTIONS  °· Try controlling your nosebleed by pinching your nostrils gently and continuously for at least 10 minutes. °· Avoid blowing or sniffing your nose for a number of hours after having a nosebleed. °· Do not put gauze inside your nose yourself. If your nose was packed by your health care provider, try to maintain the pack inside of your nose until your health care provider removes it. °¨ If a gauze pack was used and it starts to fall out, gently replace it or cut off the end of it. °¨ If a  balloon catheter was used to pack your nose, do not cut or remove it unless your health care provider has instructed you to do that. °· Avoid lying down while you are having a nosebleed. Sit up and lean forward. °· Use a nasal spray decongestant to help with a nosebleed as directed by your health care provider. °· Do not use petroleum jelly or mineral oil in your nose. These can drip into your lungs. °· Maintain humidity in your home by using less air conditioning or by using a humidifier. °· Aspirin and blood thinners make bleeding more likely. If you are prescribed these medicines and you suffer from nosebleeds, ask your health care provider if you should stop taking the medicines or adjust the dose. Do not stop medicines unless directed by your health care provider °· Resume your normal activities as you are able, but avoid straining, lifting, or bending at the waist for several days. °· If your nosebleed was caused by dry mucous membranes, use over-the-counter saline nasal spray or gel. This will keep the mucous membranes moist and allow them to heal. If you must use a lubricant, choose the water-soluble variety. Use it only sparingly, and   do not use it within several hours of lying down. °· Keep all follow-up visits as directed by your health care provider. This is important. °SEEK MEDICAL CARE IF: °· You have a fever. °· You get frequent nosebleeds. °· You are getting nosebleeds more often. °SEEK IMMEDIATE MEDICAL CARE IF: °· Your nosebleed lasts longer than 20 minutes. °· Your nosebleed occurs after an injury to your face, and your nose looks crooked or broken. °· You have unusual bleeding from other parts of your body. °· You have unusual bruising on other parts of your body. °· You feel light-headed or you faint. °· You become sweaty. °· You vomit blood. °· Your nosebleed occurs after a head injury. °  °This information is not intended to replace advice given to you by your health care provider. Make sure  you discuss any questions you have with your health care provider. °  °Document Released: 04/20/2005 Document Revised: 08/01/2014 Document Reviewed: 02/24/2014 °Elsevier Interactive Patient Education ©2016 [employer] Inc[employer] ° °

## 2015-06-09 NOTE — Telephone Encounter (Signed)
If he can not get bleeding to stop, agree with ER

## 2015-06-09 NOTE — Telephone Encounter (Signed)
Patient Name: Joseph Armstrong  DOB: 1960/08/07    Initial Comment Caller states his nose has been bleeding for half an hour, second day in a row it's happened   Nurse Assessment  Nurse: Leilani Merl, RN, Nira Conn Date/Time (Eastern Time): 06/09/2015 9:59:04 AM  Confirm and document reason for call. If symptomatic, describe symptoms. ---Caller states his nose has been bleeding for half an hour, , it bled the day before yesterday  Has the patient traveled out of the country within the last 30 days? ---Not Applicable  Does the patient have any new or worsening symptoms? ---Yes  Will a triage be completed? ---Yes  Related visit to physician within the last 2 weeks? ---No  Does the PT have any chronic conditions? (i.e. diabetes, asthma, etc.) ---Yes  List chronic conditions. ---HTN     Guidelines    Guideline Title Affirmed Question Affirmed Notes  Nosebleed [1] Bleeding present > 30 minutes AND [2] using correct method of direct pressure    Final Disposition User   Go to ED Now Standifer, RN, Heather    Referrals  GO TO FACILITY OTHER - SPECIFY  GO TO FACILITY OTHER - SPECIFY   Disagree/Comply: Comply

## 2015-06-09 NOTE — ED Provider Notes (Signed)
CSN: [id number]     Arrival date & time 06/09/15  1100 History   First MD Initiated Contact with Patient 06/09/15 1130     Chief Complaint  Patient presents with  . Hypertension  . Epistaxis     (Consider location/radiation/quality/duration/timing/severity/associated sxs/prior Treatment) Patient is a 54 y.o. male presenting with nosebleeds. The history is provided by the patient.  Epistaxis Location:  Bilateral Severity:  Mild Duration:  30 minutes Timing:  Intermittent Progression:  Waxing and waning Chronicity:  New Context: aspirin use and hypertension (history of)   Context: not anticoagulants   Relieved by:  Nothing Worsened by:  Nothing tried Ineffective treatments:  None tried Associated symptoms: no blood in oropharynx, no fever and no syncope   Risk factors: change in medication (stopped taking blood pressure medication)   Risk factors: no frequent nosebleeds     Past Medical History  Diagnosis Date  . Hypertension   . Kidney stones   . Atrial fibrillation ([place])   . Sleep apnea   . GERD (gastroesophageal reflux disease)    Past Surgical History  Procedure Laterality Date  . Appendectomy    . Colonoscopy N/A 06/08/2015    Procedure: COLONOSCOPY;  Surgeon: Mauri Pole, MD;  Location: [facility];  Service: Endoscopy;  Laterality: N/A;  BMI > 50   Family History  Problem Relation Age of Onset  . Stroke Father   . Hypertension Father   . Hypertension Mother   . Diabetes Maternal Grandfather   . Colon cancer Neg Hx    Social History  Substance Use Topics  . Smoking status: Never Smoker   . Smokeless tobacco: Never Used  . Alcohol Use: 0.0 oz/week    0 Standard drinks or equivalent per week     Comment: occasionally    Review of Systems  Constitutional: Negative for fever.  HENT: Positive for nosebleeds.   Cardiovascular: Negative for syncope.  All other systems reviewed and are negative.     Allergies  Review of patient's allergies  indicates no known allergies.  Home Medications   Prior to Admission medications   Medication Sig Start Date End Date Taking? Authorizing Provider  aspirin EC 325 MG tablet Take 325 mg by mouth daily.    Historical Provider, MD  Cyanocobalamin (VITAMIN B-12 IJ) Inject 1 Applicatorful as directed every 30 (thirty) days.    Historical Provider, MD  losartan-hydrochlorothiazide (HYZAAR) 100-25 MG per tablet Take 1 tablet by mouth daily. 03/10/15   Pleas Koch, NP  metoprolol (LOPRESSOR) 50 MG tablet Take 1 tablet (50 mg total) by mouth 2 (two) times daily. 02/05/15   Pleas Koch, NP   BP 223/112 mmHg  Pulse 73  Temp(Src) 98.7 F (37.1 C) (Oral)  Resp 20  SpO2 95% Physical Exam  Constitutional: He is oriented to person, place, and time. He appears well-developed and well-nourished. No distress.  HENT:  Head: Normocephalic and atraumatic.  Nose: Nose normal.  Mouth/Throat: Oropharynx is clear and moist.  Eyes: Conjunctivae are normal.  Neck: Neck supple. No tracheal deviation present.  Cardiovascular: Normal rate, regular rhythm and normal heart sounds.   Pulmonary/Chest: Effort normal and breath sounds normal. No respiratory distress.  Abdominal: Soft. He exhibits no distension.  Neurological: He is alert and oriented to person, place, and time. He has normal strength. No cranial nerve deficit. Coordination and gait normal.  Skin: Skin is warm and dry.  Psychiatric: He has a normal mood and affect.    ED Course  Procedures (including critical care time) Labs Review Labs Reviewed - No data to display  Imaging Review No results found. I have personally reviewed and evaluated these images and lab results as part of my medical decision-making.   EKG Interpretation None      MDM   Final diagnoses:  Chronic hypertension  Epistaxis  Personal history of noncompliance with medical treatment and regimen    54 year old male presents with 2 nosebleeds that were  anterior in nature and self-limited after direct pressure was applied. He checked his blood pressure after that time and noted to be extremely high. He has history of high blood pressure and has been noncompliant with his combination medications over the last week. I explained that nosebleeds were unlikely to be secondary to his high blood pressure alone but that he should continue his outpatient therapy and follow-up closely with his primary care physician for titration of medications. No changes necessary as patient is a symptomatic currently and given precautions on recurrent bleeding from the nose.    Leo Grosser, MD 06/10/15 [phone]

## 2015-06-09 NOTE — ED Notes (Signed)
Pt c/o nose bleed x 2 days upon waking; pt sts hx of htn and is non compliant with meds; pt noted to be hypertensive

## 2015-06-11 ENCOUNTER — Encounter: Payer: Self-pay | Admitting: Family Medicine

## 2015-06-11 ENCOUNTER — Ambulatory Visit (INDEPENDENT_AMBULATORY_CARE_PROVIDER_SITE_OTHER): Payer: 59 | Admitting: Family Medicine

## 2015-06-11 VITALS — BP 146/102 | HR 75 | Temp 98.3°F | Ht 70.0 in | Wt 379.8 lb

## 2015-06-11 DIAGNOSIS — R040 Epistaxis: Secondary | ICD-10-CM

## 2015-06-11 DIAGNOSIS — I10 Essential (primary) hypertension: Secondary | ICD-10-CM

## 2015-06-11 DIAGNOSIS — B372 Candidiasis of skin and nail: Secondary | ICD-10-CM | POA: Diagnosis not present

## 2015-06-11 DIAGNOSIS — I4891 Unspecified atrial fibrillation: Secondary | ICD-10-CM | POA: Diagnosis not present

## 2015-06-11 MED ORDER — HYDROCHLOROTHIAZIDE 25 MG PO TABS: 25.0000 mg | ORAL_TABLET | Freq: Every day | ORAL | Status: DC

## 2015-06-11 MED ORDER — AMLODIPINE BESYLATE 10 MG PO TABS: 10.0000 mg | ORAL_TABLET | Freq: Every day | ORAL | Status: DC

## 2015-06-11 MED ORDER — NYSTATIN 100000 UNIT/GM EX CREA: 1.0000 "application " | TOPICAL_CREAM | Freq: Two times a day (BID) | CUTANEOUS | Status: DC

## 2015-06-11 NOTE — Progress Notes (Signed)
   Subjective:    Patient ID: Joseph Armstrong, male    DOB: [birth year], 54 y.o.   MRN: [id number]  HPI  54 year old male pt of Allie Bossier, NP with history of Afib, HTNpresents for BP check.  He has noted BP elevated in last few days.  H e has sometimes been noncompliant with his BP meds. Losartan HCTZ 100/25 ( states when he takes it it makes him light headed and metoprolol 50 mg twice daily.  BP elevated on 11/14 during colonoscopy. Seen in ER on 11/15 with BP elevation and epistaxsis ( anterior). He has had 3 nosebleeds, most recent last night. Did not see anything per pt to be packed or burned. Called EMS last night.. Felt faint, nosebleed for 1 hour EMS BP  180/105. Had been taking med.  He has not take BP meds today!!!!  He has slight headache today. BP Readings from Last 3 Encounters:  06/11/15 146/102  06/09/15 198/106  06/08/15 200/109   He states he feels good overall. He has not taken blood pressure today. No chest pain.No easy bruising.  CBC not checked in ER.   Social History /Family History/Past Medical History reviewed and updated if needed.   Review of Systems  Constitutional: Negative for fever and fatigue.  HENT: Negative for ear pain.   Eyes: Negative for pain.  Respiratory: Negative for cough and shortness of breath.   Cardiovascular: Negative for chest pain, palpitations and leg swelling.  Gastrointestinal: Negative for abdominal pain.  Genitourinary: Negative for dysuria.  Musculoskeletal: Negative for arthralgias.  Skin: Positive for rash.       ithcy red rash present for weeks under pannus.  Neurological: Negative for syncope, light-headedness and headaches.  Psychiatric/Behavioral: Negative for dysphoric mood.       Objective:   Physical Exam  Constitutional: Vital signs are normal. He appears well-developed and well-nourished.  Morbidly obese  HENT:  Head: Normocephalic.  Right Ear: Hearing normal.  Left Ear: Hearing normal.  Nose: Nose  normal.  Mouth/Throat: Oropharynx is clear and moist and mucous membranes are normal.  Neck: Trachea normal. Carotid bruit is not present. No thyroid mass and no thyromegaly present.  Cardiovascular: Normal rate, regular rhythm and normal pulses.  Exam reveals no gallop, no distant heart sounds and no friction rub.   No murmur heard. No peripheral edema  Pulmonary/Chest: Effort normal and breath sounds normal. No respiratory distress.  Skin: Skin is warm, dry and intact. Rash noted.  Hyperpigemnted, red tinted rash under (pannus, excoriations.  Psychiatric: He has a normal mood and affect. His speech is normal and behavior is normal. Thought content normal.          Assessment & Plan:

## 2015-06-11 NOTE — Patient Instructions (Addendum)
Nystatin cream for rash twice daily for weeks, continue for at least 48 hours after rash resolves. Stop losartan HCTZ given SE.  Change to amlodipine 10 mg daily, HCTZ 25 mg daily and continue metoprolol twice daily.  Go get BP med ASAP and take.  Call if BP remains elevated tommorow. Goal < 140/90. Stop at front desk for ENT referral  Follow up in 1 week with [name] or myself.

## 2015-06-11 NOTE — Assessment & Plan Note (Signed)
Treat with topical nystatin. 

## 2015-06-11 NOTE — Assessment & Plan Note (Addendum)
Pt previously noncompliant with meds, but now ready to be compliant. Had SE o losartan HCTZ.  Will change to HCTZ alone and start amlodipine 10 mg daily . Continue metoprolol.  Encouraged exercise, weight loss, healthy eating habits. Warned pt he will likely need multiple BP meds unless weight loss and lifestyle changes.

## 2015-06-11 NOTE — Progress Notes (Signed)
Pre visit review using our clinic review tool, if applicable. No additional management support is needed unless otherwise documented below in the visit note. 

## 2015-06-11 NOTE — Assessment & Plan Note (Signed)
In part due to BP elevation. First get BP down. Check cbc for extent of anemia and for platelet issue. May be due to dry weather.  No issue noted on superficial nose exam.  Refer to ENT for further recs.

## 2015-06-11 NOTE — Assessment & Plan Note (Signed)
Rate controlled 

## 2015-06-12 ENCOUNTER — Emergency Department (HOSPITAL_COMMUNITY)
Admission: EM | Admit: 2015-06-12 | Discharge: 2015-06-12 | Disposition: A | Discharge status: Home / Self Care | Payer: 59 | Attending: Emergency Medicine | Admitting: Emergency Medicine

## 2015-06-12 ENCOUNTER — Encounter (HOSPITAL_COMMUNITY): Payer: Self-pay | Admitting: Neurology

## 2015-06-12 DIAGNOSIS — I10 Essential (primary) hypertension: Secondary | ICD-10-CM | POA: Diagnosis not present

## 2015-06-12 DIAGNOSIS — Z7982 Long term (current) use of aspirin: Secondary | ICD-10-CM | POA: Insufficient documentation

## 2015-06-12 DIAGNOSIS — Z8719 Personal history of other diseases of the digestive system: Secondary | ICD-10-CM | POA: Diagnosis not present

## 2015-06-12 DIAGNOSIS — Z87442 Personal history of urinary calculi: Secondary | ICD-10-CM | POA: Diagnosis not present

## 2015-06-12 DIAGNOSIS — Z8669 Personal history of other diseases of the nervous system and sense organs: Secondary | ICD-10-CM | POA: Insufficient documentation

## 2015-06-12 DIAGNOSIS — R040 Epistaxis: Secondary | ICD-10-CM | POA: Insufficient documentation

## 2015-06-12 DIAGNOSIS — Z79899 Other long term (current) drug therapy: Secondary | ICD-10-CM | POA: Insufficient documentation

## 2015-06-12 DIAGNOSIS — I4891 Unspecified atrial fibrillation: Secondary | ICD-10-CM | POA: Diagnosis not present

## 2015-06-12 LAB — CBC WITH DIFFERENTIAL/PLATELET
Basophils Absolute: 0 10*3/uL (ref 0.0–0.1)
Basophils Relative: 0 %
Eosinophils Absolute: 0.1 10*3/uL (ref 0.0–0.7)
Eosinophils Relative: 2 %
HEMATOCRIT: 46.3 % (ref 39.0–52.0)
Hemoglobin: 14.7 g/dL (ref 13.0–17.0)
LYMPHS ABS: 1.6 10*3/uL (ref 0.7–4.0)
LYMPHS PCT: 20 %
MCH: 25.6 pg — AB (ref 26.0–34.0)
MCHC: 31.7 g/dL (ref 30.0–36.0)
MCV: 80.5 fL (ref 78.0–100.0)
MONO ABS: 0.7 10*3/uL (ref 0.1–1.0)
MONOS PCT: 9 %
NEUTROS ABS: 5.3 10*3/uL (ref 1.7–7.7)
Neutrophils Relative %: 69 %
Platelets: 194 10*3/uL (ref 150–400)
RBC: 5.75 MIL/uL (ref 4.22–5.81)
RDW: 16.3 % — AB (ref 11.5–15.5)
WBC: 7.7 10*3/uL (ref 4.0–10.5)

## 2015-06-12 MED ORDER — CEPHALEXIN 500 MG PO CAPS: 500.0000 mg | ORAL_CAPSULE | Freq: Two times a day (BID) | ORAL | Status: DC

## 2015-06-12 MED ORDER — COCAINE HCL 4 % EX SOLN
4.0000 mL | Freq: Once | CUTANEOUS | Status: AC
  Administered 2015-06-12: 4 mL via TOPICAL
  Filled 2015-06-12: qty 4

## 2015-06-12 NOTE — ED Provider Notes (Signed)
5:46 PM Dr. Lucia Gaskins has seen patient and has clean out his nose and packed the right nare. Would like patient to be discharged on Keflex 500 mg twice a day for 1 week. He will see patient next week to remove packing.  Sherwood Gambler, MD 06/12/15 [phone]

## 2015-06-12 NOTE — Discharge Instructions (Signed)

## 2015-06-12 NOTE — Consult Note (Signed)
Reason for Consult:epistaxis Referring Physician: [facility]  Joseph Armstrong is an 54 y.o. male.  HPI: Patient with history of recurrent epistaxis from the right nostril for the past 2 weeks. He's had elevated BP and takes daily ASA. Also has OSA and uses nasal CPAP. Developed nasal bleeding this am and couldn't stop it with pressure. Presented to ER but bleeding had stopped but had blood clots on both sides and couldn't breath through his nose.  Past Medical History  Diagnosis Date  . Hypertension   . Kidney stones   . Atrial fibrillation ([place])   . Sleep apnea   . GERD (gastroesophageal reflux disease)     Past Surgical History  Procedure Laterality Date  . Appendectomy    . Colonoscopy N/A 06/08/2015    Procedure: COLONOSCOPY;  Surgeon: Mauri Pole, MD;  Location: [facility];  Service: Endoscopy;  Laterality: N/A;  BMI > 50    Social History:  reports that he has never smoked. He has never used smokeless tobacco. He reports that he drinks alcohol. He reports that he does not use illicit drugs.  Allergies: No Known Allergies  Medications: I have reviewed the patient's current medications.  Results for orders placed or performed during the hospital encounter of 06/12/15 (from the past 48 hour(s))  CBC with Differential     Status: Abnormal   Collection Time: 06/12/15  2:29 PM  Result Value Ref Range   WBC 7.7 4.0 - 10.5 K/uL   RBC 5.75 4.22 - 5.81 MIL/uL   Hemoglobin 14.7 13.0 - 17.0 g/dL   HCT 46.3 39.0 - 52.0 %   MCV 80.5 78.0 - 100.0 fL   MCH 25.6 (L) 26.0 - 34.0 pg   MCHC 31.7 30.0 - 36.0 g/dL   RDW 16.3 (H) 11.5 - 15.5 %   Platelets 194 150 - 400 K/uL   Neutrophils Relative % 69 %   Neutro Abs 5.3 1.7 - 7.7 K/uL   Lymphocytes Relative 20 %   Lymphs Abs 1.6 0.7 - 4.0 K/uL   Monocytes Relative 9 %   Monocytes Absolute 0.7 0.1 - 1.0 K/uL   Eosinophils Relative 2 %   Eosinophils Absolute 0.1 0.0 - 0.7 K/uL   Basophils Relative 0 %   Basophils Absolute 0.0 0.0 -  0.1 K/uL    No results found.  [id number] sided epstaxis   [id number] blood clots. Cleaned the nose with suction and neosenephrine and 4% cocaine solution as patient had a lot of pain and discomfort. After cleaning the nose nasal passages clear with no active bleeding. Could not identify site or source of bleeding. No masses noted. Packed the right side of the nose with Vaseline gauze packing and long merocel pack  Assessment/Plan: Right sided epistaxis   Nose packed. Patient will follow up in my office next Wed at 11:30 to have the nasal pack removed Keflex 500 mg bid Take blood pressure meds and rest this weekend Stop ASA  Yoshiko Keleher 06/12/2015, 5:45 PM

## 2015-06-12 NOTE — ED Notes (Signed)
Dr. Lucia Gaskins at bedside working with patient.

## 2015-06-12 NOTE — ED Notes (Signed)
MD made aware pt BP elevated. Pt due to take metoprolol at 1800. Will take when he gets home.

## 2015-06-12 NOTE — ED Notes (Signed)
Per ems- Pt comes from PCP today for nosebleed x 3 for 2 days. Takes aspirin. Has hx HTN. Pt is a x 4. Unable to get BP but has strong radial pulses. Given Afrin spray.

## 2015-06-12 NOTE — ED Notes (Signed)
Dr. Lucia Gaskins placing packing to pt's right nostril. There is no bleeding.

## 2015-06-12 NOTE — ED Provider Notes (Signed)
CSN: [id number]     Arrival date & time 06/12/15  1220 History   First MD Initiated Contact with Patient 06/12/15 1228     Chief Complaint  Patient presents with  . Epistaxis     (Consider location/radiation/quality/duration/timing/severity/associated sxs/prior Treatment) Patient is a 54 y.o. male presenting with nosebleeds.  Epistaxis Location:  R nare Severity:  Severe Timing:  Constant Progression:  Worsening Chronicity:  New Context: aspirin use   Context: not anticoagulants   Relieved by:  Nothing Worsened by:  Nothing tried Associated symptoms: no blood in oropharynx, no cough, no fever, no headaches and no sore throat   Risk factors: frequent nosebleeds (over last 3 days)   Risk factors: no change in medication     Past Medical History  Diagnosis Date  . Hypertension   . Kidney stones   . Atrial fibrillation ([place])   . Sleep apnea   . GERD (gastroesophageal reflux disease)    Past Surgical History  Procedure Laterality Date  . Appendectomy    . Colonoscopy N/A 06/08/2015    Procedure: COLONOSCOPY;  Surgeon: Mauri Pole, MD;  Location: [facility];  Service: Endoscopy;  Laterality: N/A;  BMI > 50   Family History  Problem Relation Age of Onset  . Stroke Father   . Hypertension Father   . Hypertension Mother   . Diabetes Maternal Grandfather   . Colon cancer Neg Hx    Social History  Substance Use Topics  . Smoking status: Never Smoker   . Smokeless tobacco: Never Used  . Alcohol Use: 0.0 oz/week    0 Standard drinks or equivalent per week     Comment: occasionally    Review of Systems  Constitutional: Negative for fever.  HENT: Positive for nosebleeds. Negative for sore throat.   Eyes: Negative for visual disturbance.  Respiratory: Negative for cough and shortness of breath.   Cardiovascular: Negative for chest pain.  Gastrointestinal: Negative for nausea, vomiting, abdominal pain, diarrhea and constipation.  Genitourinary: Negative for  difficulty urinating.  Musculoskeletal: Negative for back pain and neck stiffness.  Skin: Negative for rash.  Neurological: Negative for syncope and headaches.      Allergies  Review of patient's allergies indicates no known allergies.  Home Medications   Prior to Admission medications   Medication Sig Start Date End Date Taking? Authorizing Provider  amLODipine (NORVASC) 10 MG tablet Take 1 tablet (10 mg total) by mouth daily. 06/11/15   Jinny Sanders, MD  aspirin EC 325 MG tablet Take 325 mg by mouth daily.    Historical Provider, MD  Cyanocobalamin (VITAMIN B-12 IJ) Inject 1 Applicatorful as directed every 30 (thirty) days.    Historical Provider, MD  hydrochlorothiazide (HYDRODIURIL) 25 MG tablet Take 1 tablet (25 mg total) by mouth daily. 06/11/15   Amy Cletis [place], MD  metoprolol (LOPRESSOR) 50 MG tablet Take 1 tablet (50 mg total) by mouth 2 (two) times daily. 02/05/15   Pleas Koch, NP  nystatin cream (MYCOSTATIN) Apply 1 application topically 2 (two) times daily. 06/11/15   Amy E Bedsole, MD   BP 168/107 mmHg  Pulse 65  Temp(Src) 98 F (36.7 C) (Oral)  Resp 16  SpO2 94% Physical Exam  Constitutional: He is oriented to person, place, and time. He appears well-developed and well-nourished. No distress.  HENT:  Head: Normocephalic and atraumatic.  Nose: Epistaxis (on initial evaluation, blood present in both nares, unable to visualize, on follow up, patient has no sign of bleeding  from left nares, right nares with clot present) is observed.  Mouth/Throat: No oropharyngeal exudate.  Eyes: Conjunctivae and EOM are normal. Pupils are equal, round, and reactive to light.  Neck: Normal range of motion.  Cardiovascular: Normal rate, regular rhythm, normal heart sounds and intact distal pulses.  Exam reveals no gallop and no friction rub.   No murmur heard. Pulmonary/Chest: Effort normal and breath sounds normal. No respiratory distress. He has no wheezes. He has no rales.   Abdominal: Soft. He exhibits no distension. There is no tenderness. There is no guarding.  Musculoskeletal: He exhibits no edema.  Neurological: He is alert and oriented to person, place, and time.  Skin: Skin is warm and dry. He is not diaphoretic.  Nursing note and vitals reviewed.   ED Course  Procedures (including critical care time) Labs Review Labs Reviewed  CBC WITH DIFFERENTIAL/PLATELET - Abnormal; Notable for the following:    MCH 25.6 (*)    RDW 16.3 (*)    All other components within normal limits    Imaging Review No results found. I have personally reviewed and evaluated these images and lab results as part of my medical decision-making.   EKG Interpretation None      MDM   Final diagnoses:  Epistaxis    54 year old male with a history of hypertension, atrial fibrillation on ASA. obesity presents with concern for epistaxis. Patient has had episodes of epistaxis every day for the past 3 days. He is no prior history. Denies any nasal trauma, anticoagulant use or nose picking.  Patient was seen in the ED on the 15th, by his PCP yesterday, and by ear nose and throat doctor Dr. Lucia Gaskins yesterday.  Reports he attempted to go to ENT office, however Dr. Lucia Gaskins was unable to see him right away and patient proceeded to the emergency department.  Patient was given 5 sprays of Afrin in the ED, and held constant pressure with resolution of bleeding and formation of clot on right side.  History and exam most consistent with anterior nosebleed.  Given family's concern of new nosebleeds and frequency, labs are obtained which showed normal hemoglobin and normal platelets.  Called Dr. Lucia Gaskins given patient reporting he is to see him if he is bleeding again, and Dr. Lucia Gaskins will come to the ED at 1600 to evaluate the patient.    Gareth Morgan, MD 06/12/15 [phone]

## 2015-06-15 ENCOUNTER — Ambulatory Visit: Payer: 59 | Admitting: Primary Care

## 2015-06-15 ENCOUNTER — Ambulatory Visit: Payer: 59 | Admitting: Family Medicine

## 2015-06-22 ENCOUNTER — Ambulatory Visit (INDEPENDENT_AMBULATORY_CARE_PROVIDER_SITE_OTHER): Payer: 59 | Admitting: Primary Care

## 2015-06-22 ENCOUNTER — Encounter: Payer: Self-pay | Admitting: Primary Care

## 2015-06-22 VITALS — BP 128/76 | HR 64 | Temp 98.0°F | Ht 70.0 in | Wt 385.8 lb

## 2015-06-22 DIAGNOSIS — E538 Deficiency of other specified B group vitamins: Secondary | ICD-10-CM | POA: Diagnosis not present

## 2015-06-22 DIAGNOSIS — R7303 Prediabetes: Secondary | ICD-10-CM | POA: Insufficient documentation

## 2015-06-22 DIAGNOSIS — Z6841 Body Mass Index (BMI) 40.0 and over, adult: Secondary | ICD-10-CM

## 2015-06-22 DIAGNOSIS — I10 Essential (primary) hypertension: Secondary | ICD-10-CM

## 2015-06-22 LAB — HEMOGLOBIN A1C: HEMOGLOBIN A1C: 6.2 % (ref 4.6–6.5)

## 2015-06-22 LAB — VITAMIN B12: Vitamin B-12: 289 pg/mL (ref 211–911)

## 2015-06-22 MED ORDER — CYANOCOBALAMIN 1000 MCG/ML IJ SOLN
1000.0000 ug | Freq: Once | INTRAMUSCULAR | Status: AC
  Administered 2015-06-22: 1000 ug via INTRAMUSCULAR

## 2015-06-22 NOTE — Progress Notes (Signed)
Subjective:    Patient ID: Joseph Armstrong, male    DOB: 05-02-1961, 54 y.o.   MRN: [id number]  HPI  Joseph Armstrong is a 54 year old male who presents today for follow up of diabetes, hypertension, and for vitamin B12 injection.   1) Borderline Diabetes: A1C of 6.0 in August 2016, due again today. Denies numbness/tingling, dizziness. He's had a few episodes of "cloudniess" to his vision 2 weeks ago which has not re-occurred.   Wt Readings from Last 3 Encounters:  06/22/15 385 lb 12.8 oz (174.998 kg)  06/11/15 379 lb 12.8 oz (172.276 kg)  06/08/15 384 lb (174.181 kg)     2) Essential Hypertension: Currently managed on Amlodipine 10 mg, HCTZ 25 mg, and lopressor 25 mg BID. He was evaluated on 06/11/15 for HTN and was switched to HCTZ 25 mg and added Amlodipine 10 mg. His BP is stable in office today. He denies headaches, chest pain, dizziness.  BP Readings from Last 3 Encounters:  06/22/15 128/76  06/12/15 186/98  06/11/15 146/102     3) Vitamin B 12 Deficiency: Currently obtaining monthly injections. Last B 12 lab was July 2016 with level of 190. He's been doing injections for about 12 months now with last injection due today. B 12 lab due today.  Review of Systems  Constitutional: Negative for unexpected weight change.  Respiratory: Negative for shortness of breath.   Cardiovascular: Negative for chest pain.  Neurological: Negative for dizziness, numbness and headaches.       Past Medical History  Diagnosis Date  . Hypertension   . Kidney stones   . Atrial fibrillation ([place])   . Sleep apnea   . GERD (gastroesophageal reflux disease)     Social History   Social History  . Marital Status: Married    Spouse Name: N/A  . Number of Children: N/A  . Years of Education: N/A   Occupational History  . Not on file.   Social History Main Topics  . Smoking status: Never Smoker   . Smokeless tobacco: Never Used  . Alcohol Use: 0.0 oz/week    0 Standard drinks or equivalent  per week     Comment: occasionally  . Drug Use: No  . Sexual Activity: Not on file   Other Topics Concern  . Not on file   Social History Narrative   Married.    2 children.   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     Past Surgical History  Procedure Laterality Date  . Appendectomy    . Colonoscopy N/A 06/08/2015    Procedure: COLONOSCOPY;  Surgeon: Mauri Pole, MD;  Location: [facility];  Service: Endoscopy;  Laterality: N/A;  BMI > 50    Family History  Problem Relation Age of Onset  . Stroke Father   . Hypertension Father   . Hypertension Mother   . Diabetes Maternal Grandfather   . Colon cancer Neg Hx     No Known Allergies  Current Outpatient Prescriptions on File Prior to Visit  Medication Sig Dispense Refill  . amLODipine (NORVASC) 10 MG tablet Take 1 tablet (10 mg total) by mouth daily. 30 tablet 3  . aspirin EC 81 MG tablet Take 81 mg by mouth daily.    . Cyanocobalamin (VITAMIN B-12 IJ) Inject 1,000 mcg as directed every 30 (thirty) days.     . hydrochlorothiazide (HYDRODIURIL) 25 MG tablet Take 1 tablet (25 mg total)  by mouth daily. 30 tablet 3  . metoprolol (LOPRESSOR) 50 MG tablet Take 1 tablet (50 mg total) by mouth 2 (two) times daily. 60 tablet 5  . nystatin cream (MYCOSTATIN) Apply 1 application topically 2 (two) times daily. 60 g 1   No current facility-administered medications on file prior to visit.    BP 128/76 mmHg  Pulse 64  Temp(Src) 98 F (36.7 C) (Oral)  Ht 5\' 10"  (1.778 m)  Wt 385 lb 12.8 oz (174.998 kg)  BMI 55.36 kg/m2  SpO2 97%    Objective:   Physical Exam  Constitutional: He appears well-nourished.  Cardiovascular: Normal rate and regular rhythm.   Pulmonary/Chest: Effort normal and breath sounds normal.  Skin: Skin is warm and dry.          Assessment & Plan:

## 2015-06-22 NOTE — Assessment & Plan Note (Signed)
Spent a long time discussing importance of weight loss through diet and exercise, especially to prevent co-morbidities. Recommendations provided today. Will continue to monitor.

## 2015-06-22 NOTE — Assessment & Plan Note (Signed)
A1C of 6.0 in August 2016, due again today for re-check. Discussed importance of healthy diet and regular exercise to prevent diabetes.

## 2015-06-22 NOTE — Assessment & Plan Note (Signed)
Due today for last injection and blood draw. Will have him transition to OTC oral tablets if stable.

## 2015-06-22 NOTE — Progress Notes (Signed)
Pre visit review using our clinic review tool, if applicable. No additional management support is needed unless otherwise documented below in the visit note. 

## 2015-06-22 NOTE — Assessment & Plan Note (Addendum)
Stable in office today. Continue HCTZ 25, amlodipine 10 mg, and lopressor 25 mg BID. Will have him follow up in 6 months.

## 2015-06-22 NOTE — Patient Instructions (Addendum)
Complete lab work prior to leaving today. I will notify you of your results.  Work to reduce your weight through healthy diet and exercise. Reduce consumption of breads, rice, pastas, fast food. Increase fresh fruits and vegetables and lean meats.   Start exercising. You should be getting 1 hour of moderate intensity exercise 5 days weekly.  Please schedule a follow up appointment in 6 months for re-evaluation of blood pressure.   It was a pleasure to see you today!

## 2015-06-23 ENCOUNTER — Ambulatory Visit: Payer: 59 | Admitting: Primary Care

## 2015-06-23 ENCOUNTER — Encounter: Payer: Self-pay | Admitting: *Deleted

## 2015-06-25 ENCOUNTER — Encounter: Payer: Self-pay | Admitting: Gastroenterology

## 2015-08-31 ENCOUNTER — Institutional Professional Consult (permissible substitution): Payer: 59 | Admitting: Pulmonary Disease

## 2015-09-28 ENCOUNTER — Other Ambulatory Visit: Payer: 59

## 2015-11-09 ENCOUNTER — Other Ambulatory Visit: Payer: Self-pay | Admitting: Family Medicine

## 2015-11-11 ENCOUNTER — Other Ambulatory Visit: Payer: Self-pay | Admitting: Family Medicine

## 2015-12-08 ENCOUNTER — Ambulatory Visit: Payer: Self-pay | Admitting: Primary Care

## 2015-12-15 ENCOUNTER — Ambulatory Visit (INDEPENDENT_AMBULATORY_CARE_PROVIDER_SITE_OTHER): Payer: BLUE CROSS/BLUE SHIELD | Admitting: Primary Care

## 2015-12-15 ENCOUNTER — Encounter: Payer: Self-pay | Admitting: Primary Care

## 2015-12-15 VITALS — BP 134/84 | HR 75 | Temp 98.0°F | Ht 70.0 in | Wt 394.8 lb

## 2015-12-15 DIAGNOSIS — M542 Cervicalgia: Secondary | ICD-10-CM

## 2015-12-15 DIAGNOSIS — B372 Candidiasis of skin and nail: Secondary | ICD-10-CM | POA: Diagnosis not present

## 2015-12-15 DIAGNOSIS — I10 Essential (primary) hypertension: Secondary | ICD-10-CM | POA: Diagnosis not present

## 2015-12-15 MED ORDER — METOPROLOL TARTRATE 50 MG PO TABS: 50.0000 mg | ORAL_TABLET | Freq: Two times a day (BID) | ORAL | Status: DC

## 2015-12-15 MED ORDER — HYDROCHLOROTHIAZIDE 25 MG PO TABS: 25.0000 mg | ORAL_TABLET | Freq: Every day | ORAL | Status: DC

## 2015-12-15 MED ORDER — NYSTATIN 100000 UNIT/GM EX CREA: 1.0000 "application " | TOPICAL_CREAM | Freq: Two times a day (BID) | CUTANEOUS | Status: DC

## 2015-12-15 MED ORDER — AMLODIPINE BESYLATE 10 MG PO TABS: 10.0000 mg | ORAL_TABLET | Freq: Every day | ORAL | Status: DC
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
## 2015-12-15 NOTE — Progress Notes (Signed)
Pre visit review using our clinic review tool, if applicable. No additional management support is needed unless otherwise documented below in the visit note. 

## 2015-12-15 NOTE — Patient Instructions (Addendum)
Stop by the front desk and speak with either Rosaria Ferries or Ebony Hail regarding your referral to Physical Therapy.  I sent refills of your medication to your pharmacy.  Please call me when you get home to read me the name of the other medication that was prescribed by the chiropractor.  Obtain a knee brace as discussed today.  You are due for your annual physical in August 2017, please schedule this at your convenience.   It was a pleasure to see you today!  Knee Pain Knee pain is a very common symptom and can have many causes. Knee pain often goes away when you follow your health care provider's instructions for relieving pain and discomfort at home. However, knee pain can develop into a condition that needs treatment. Some conditions may include:  Arthritis caused by wear and tear (osteoarthritis).  Arthritis caused by swelling and irritation (rheumatoid arthritis or gout).  A cyst or growth in your knee.  An infection in your knee joint.  An injury that will not heal.  Damage, swelling, or irritation of the tissues that support your knee (torn ligaments or tendinitis). If your knee pain continues, additional tests may be ordered to diagnose your condition. Tests may include X-rays or other imaging studies of your knee. You may also need to have fluid removed from your knee. Treatment for ongoing knee pain depends on the cause, but treatment may include:  Medicines to relieve pain or swelling.  Steroid injections in your knee.  Physical therapy.  Surgery. HOME CARE INSTRUCTIONS  Take medicines only as directed by your health care provider.  Rest your knee and keep it raised (elevated) while you are resting.  Do not do things that cause or worsen pain.  Avoid high-impact activities or exercises, such as running, jumping rope, or doing jumping jacks.  Apply ice to the knee area:  Put ice in a plastic bag.  Place a towel between your skin and the bag.  Leave the ice on for  20 minutes, 2-3 times a day.  Ask your health care provider if you should wear an elastic knee support.  Keep a pillow under your knee when you sleep.  Lose weight if you are overweight. Extra weight can put pressure on your knee.  Do not use any tobacco products, including cigarettes, chewing tobacco, or electronic cigarettes. If you need help quitting, ask your health care provider. Smoking may slow the healing of any bone and joint problems that you may have. SEEK MEDICAL CARE IF:  Your knee pain continues, changes, or gets worse.  You have a fever along with knee pain.  Your knee buckles or locks up.  Your knee becomes more swollen. SEEK IMMEDIATE MEDICAL CARE IF:   Your knee joint feels hot to the touch.  You have chest pain or trouble breathing.   This information is not intended to replace advice given to you by your health care provider. Make sure you discuss any questions you have with your health care provider.   Document Released: 05/08/2007 Document Revised: 08/01/2014 Document Reviewed: 02/24/2014 Elsevier Interactive Patient Education [employer].

## 2015-12-16 ENCOUNTER — Telehealth: Payer: Self-pay | Admitting: Primary Care

## 2015-12-16 NOTE — Telephone Encounter (Signed)
[name] asked patient to call back with the name of the medications he received in [place].  The name of the medications are Cyclobencaprine and Norco.

## 2015-12-16 NOTE — Telephone Encounter (Signed)
Message left for patient to return my call.  

## 2015-12-16 NOTE — Telephone Encounter (Signed)
Please notify Joseph Armstrong that I believe he will benefit from taking the Cyclobenzaprine as it is a muscle relaxant.

## 2015-12-22 NOTE — Telephone Encounter (Signed)
Message left for patient to return my call.  

## 2016-02-22 ENCOUNTER — Institutional Professional Consult (permissible substitution): Payer: Self-pay | Admitting: Pulmonary Disease

## 2016-03-14 ENCOUNTER — Encounter: Payer: BLUE CROSS/BLUE SHIELD | Admitting: Primary Care

## 2016-03-21 ENCOUNTER — Encounter: Payer: Self-pay | Admitting: Pulmonary Disease

## 2016-03-21 ENCOUNTER — Ambulatory Visit (INDEPENDENT_AMBULATORY_CARE_PROVIDER_SITE_OTHER): Payer: BLUE CROSS/BLUE SHIELD | Admitting: Pulmonary Disease

## 2016-03-21 VITALS — BP 128/76 | HR 69 | Ht 71.0 in | Wt 391.8 lb

## 2016-03-21 DIAGNOSIS — G4733 Obstructive sleep apnea (adult) (pediatric): Secondary | ICD-10-CM | POA: Diagnosis not present

## 2016-03-21 NOTE — Assessment & Plan Note (Signed)
Home sleep study followed by CPAP titration study Based on this, we will set you up with a new CPAP machine   The pathophysiology of obstructive sleep apnea , it's cardiovascular consequences & modes of treatment including CPAP were discused with the patient in detail & they evidenced understanding.  Weight loss encouraged, compliance with goal of at least 4-6 hrs every night is the expectation. Advised against medications with sedative side effects Cautioned against driving when sleepy - understanding that sleepiness will vary on a day to day basis

## 2016-03-21 NOTE — Patient Instructions (Addendum)
Home sleep study followed by CPAP titration study Based on this, we will set you up with a CPAP machine

## 2016-03-21 NOTE — Progress Notes (Signed)
Subjective:    Patient ID: Joseph Armstrong, male    DOB: 01-05-1961, 55 y.o.   MRN: [id number]  HPI   Chief Complaint  Patient presents with  . Sleep Consult    Referred by Alma Friendly,  has CPAP machine, doing well on machine, needs new machine.  ES: 55    55 year old morbidly obese man presents for management of OSA He was diagnosed more than 10 years ago in [place] and received his CPAP then from [facility] in [place]. He was symptomatically with heavy snoring increased awakenings and witnessed apnea and GERD-symptoms improved remarkably after starting CPAP. He would now like to get a new machine because his machine is old and has problems with humidity.  Epworth sleepiness score is 18 He reports sleepiness as a passenger in a car on long drives and another social situations in spite of compliance with his CPAP of at least 6 hours every night. Bedtime is around 10 PM, sleep latency is minimal, he sleeps on his back with one pillow, reports one bathroom visit around 5 AM and is out of bed at 6:30 AM feeling refreshed with occasional dryness of mouth. He is gained about 30 pounds to his current weight of 390 pounds. He is compliant with CPAP via full facemask and wife has not reportedly snoring or witnessed apneas  There is no history suggestive of cataplexy, sleep paralysis or parasomnias     Past Medical History:  Diagnosis Date  . Atrial fibrillation ([place])   . GERD (gastroesophageal reflux disease)   . Hypertension   . Kidney stones   . Sleep apnea     Past Surgical History:  Procedure Laterality Date  . APPENDECTOMY    . COLONOSCOPY N/A 06/08/2015   Procedure: COLONOSCOPY;  Surgeon: Mauri Pole, MD;  Location: [facility];  Service: Endoscopy;  Laterality: N/A;  BMI > 50    No Known Allergies   Social History   Social History  . Marital status: Married    Spouse name: N/A  . Number of children: N/A  . Years of education: N/A    Occupational History  . Not on file.   Social History Main Topics  . Smoking status: Never Smoker  . Smokeless tobacco: Never Used  . Alcohol use 0.0 oz/week     Comment: occasionally  . Drug use: No  . Sexual activity: Not on file   Other Topics Concern  . Not on file   Social History Narrative   Married.    2 children.   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      Review of Systems  Constitutional: Negative for activity change, appetite change, chills, fever and unexpected weight change.  HENT: Negative for congestion, dental problem, postnasal drip, rhinorrhea, sneezing, sore throat, trouble swallowing and voice change.   Eyes: Negative for visual disturbance.  Respiratory: Positive for apnea. Negative for cough, choking and shortness of breath.   Cardiovascular: Negative for chest pain and leg swelling.  Gastrointestinal: Negative for abdominal pain, nausea and vomiting.  Genitourinary: Negative for difficulty urinating.  Musculoskeletal: Negative for arthralgias.  Skin: Negative for rash.  Psychiatric/Behavioral: Negative for behavioral problems and confusion.       Objective:   Physical Exam  Gen. Pleasant, obese, in no distress ENT - no lesions, no post nasal drip Neck: No JVD, no thyromegaly, no carotid bruits Lungs: no use of accessory muscles, no dullness  to percussion, decreased without rales or rhonchi  Cardiovascular: Rhythm regular, heart sounds  normal, no murmurs or gallops, no peripheral edema Musculoskeletal: No deformities, no cyanosis or clubbing , no tremors       Assessment & Plan:

## 2016-04-14 ENCOUNTER — Telehealth: Payer: Self-pay | Admitting: Pulmonary Disease

## 2016-04-14 ENCOUNTER — Telehealth: Payer: Self-pay | Admitting: *Deleted

## 2016-04-14 NOTE — Telephone Encounter (Signed)
Noted  

## 2016-04-14 NOTE — Telephone Encounter (Signed)
-----   Message from Rigoberto Noel, MD sent at 04/14/2016 11:22 AM EDT ----- Regarding: RE: home sleep study, cpap titration Please convert into a phone note so it is visible in chart ----- Message ----- From: Ilona Sorrel Sent: 04/14/2016   9:49 AM To: Rigoberto Noel, MD, Glean Hess, CMA Subject: home sleep study, cpap titration               You had put in an order on 8/28 for pt to have home sleep study & cpap titration study.  I scheduled the cpap titration while he was in our office for 10/15.  I have tried to call him 6 times to set up the home sleep test & he has not returned my calls.  The 4th time I called I told him on the message to call me even if he doesn't want to do the hst.  The 5th time I called I told him I would have to cancel the cpap titration if he doesn't call me to set up the hst.  Today was call #6 and I told him on the vm I was cancelling the titration study & to call me if he decided he wanted to do these tests.  I put a message in his chart so if he calls in to the office it will be seen.  Judeen Hammans

## 2016-04-14 NOTE — Telephone Encounter (Signed)
Dr Elsworth Soho had put in an order on 8/28 for pt to have home sleep study & cpap titration study.  I scheduled the cpap titration while he was in our office for 10/15.  I have tried to call him 6 times to set up the home sleep test & he has not returned my calls.  The 4th time I called I told him on the message to call me even if he doesn't want to do the hst.  The 5th time I called I told him I would have to cancel the cpap titration if he doesn't call me to set up the hst.  Today was call #6 and I told him on the vm I was cancelling the titration study & to call me if he decided he wanted to do these tests.

## 2016-05-05 ENCOUNTER — Telehealth: Payer: Self-pay | Admitting: Pulmonary Disease

## 2016-05-05 NOTE — Telephone Encounter (Signed)
Patient calling to reschedule sleep study.  Gave him number for the Sleep Lab and advised him to contact thye Sleep Lab to reschedule appointment. Nothing further needed.

## 2016-05-08 ENCOUNTER — Encounter (HOSPITAL_BASED_OUTPATIENT_CLINIC_OR_DEPARTMENT_OTHER): Payer: BLUE CROSS/BLUE SHIELD

## 2016-11-01 ENCOUNTER — Other Ambulatory Visit: Payer: Self-pay | Admitting: Primary Care

## 2016-11-01 DIAGNOSIS — I10 Essential (primary) hypertension: Secondary | ICD-10-CM

## 2016-12-05 ENCOUNTER — Ambulatory Visit (INDEPENDENT_AMBULATORY_CARE_PROVIDER_SITE_OTHER): Payer: BLUE CROSS/BLUE SHIELD | Admitting: Primary Care

## 2016-12-05 ENCOUNTER — Encounter: Payer: Self-pay | Admitting: Primary Care

## 2016-12-05 ENCOUNTER — Encounter (INDEPENDENT_AMBULATORY_CARE_PROVIDER_SITE_OTHER): Payer: Self-pay

## 2016-12-05 VITALS — BP 144/90 | HR 67 | Temp 98.1°F | Ht 71.0 in | Wt 388.1 lb

## 2016-12-05 DIAGNOSIS — G4733 Obstructive sleep apnea (adult) (pediatric): Secondary | ICD-10-CM

## 2016-12-05 DIAGNOSIS — E538 Deficiency of other specified B group vitamins: Secondary | ICD-10-CM | POA: Diagnosis not present

## 2016-12-05 DIAGNOSIS — I10 Essential (primary) hypertension: Secondary | ICD-10-CM | POA: Diagnosis not present

## 2016-12-05 DIAGNOSIS — R7303 Prediabetes: Secondary | ICD-10-CM

## 2016-12-05 DIAGNOSIS — I4891 Unspecified atrial fibrillation: Secondary | ICD-10-CM

## 2016-12-05 LAB — COMPREHENSIVE METABOLIC PANEL
ALT: 22 U/L (ref 0–53)
AST: 17 U/L (ref 0–37)
Albumin: 3.9 g/dL (ref 3.5–5.2)
Alkaline Phosphatase: 46 U/L (ref 39–117)
BUN: 15 mg/dL (ref 6–23)
CHLORIDE: 105 meq/L (ref 96–112)
CO2: 29 meq/L (ref 19–32)
CREATININE: 1.29 mg/dL (ref 0.40–1.50)
Calcium: 9.4 mg/dL (ref 8.4–10.5)
GFR: 74.03 mL/min (ref >60.00)
GLUCOSE: 118 mg/dL — AB (ref 70–99)
Potassium: 4.3 mEq/L (ref 3.5–5.1)
SODIUM: 140 meq/L (ref 135–145)
Total Bilirubin: 0.3 mg/dL (ref 0.2–1.2)
Total Protein: 7.4 g/dL (ref 6.0–8.3)

## 2016-12-05 LAB — LIPID PANEL
Cholesterol: 164 mg/dL (ref 0–200)
HDL: 42.6 mg/dL (ref >39.00)
LDL CALC: 112 mg/dL — AB (ref 0–99)
NONHDL: 121.81
Total CHOL/HDL Ratio: 4
Triglycerides: 50 mg/dL (ref 0.0–149.0)
VLDL: 10 mg/dL (ref 0.0–40.0)

## 2016-12-05 LAB — VITAMIN B12: VITAMIN B 12: 200 pg/mL — AB (ref 211–911)

## 2016-12-05 LAB — HEMOGLOBIN A1C: Hgb A1c MFr Bld: 6.5 % (ref 4.6–6.5)

## 2016-12-05 MED ORDER — METOPROLOL TARTRATE 50 MG PO TABS
50.0000 mg | ORAL_TABLET | Freq: Two times a day (BID) | ORAL | 1 refills | Status: DC
Start: 2016-12-05 — End: 2017-07-04

## 2016-12-05 MED ORDER — AMLODIPINE BESYLATE 10 MG PO TABS: 10.0000 mg | ORAL_TABLET | Freq: Every day | ORAL | 1 refills | Status: DC

## 2016-12-05 MED ORDER — HYDROCHLOROTHIAZIDE 25 MG PO TABS: 25.0000 mg | ORAL_TABLET | Freq: Every day | ORAL | 1 refills | Status: DC

## 2016-12-05 NOTE — Progress Notes (Signed)
Subjective:    Patient ID: Joseph Armstrong, male    DOB: 03-Mar-1961, 56 y.o.   MRN: [id number]  HPI  Joseph Armstrong is a 56 year old male who presents today for follow up.  1) Essential Hypertension: Currently managed on Amlodipine 10 mg, HCTZ 25 mg, and metoprolol tartrate 50 mg BID. His BP in the office today is 144/90. He is not checking his BP at home. He took his BP medications prior to his visit this morning. He denies chest pain, shortness of breath, headaches, dizziness.   2) Prediabetes: A1C in November 2016 of 6.2. He endorses a fair diet and is not exercising. He's "trying" to work on improvements in his diet.   3) Vitamin B 12 Deficiency: Last B 12 level of 289 in November 2016. He's not taking Vitamin B 12 tablets and has not been getting injections. He's felt fatigued for the past 6-8 months. He denies a prior history of pernicious anemia.  Review of Systems  Constitutional: Positive for fatigue.  Respiratory: Negative for shortness of breath.   Cardiovascular: Negative for chest pain.  Neurological: Negative for dizziness, numbness and headaches.       Past Medical History:  Diagnosis Date  . Atrial fibrillation ([place])   . GERD (gastroesophageal reflux disease)   . Hypertension   . Kidney stones   . Sleep apnea      Social History   Social History  . Marital status: Married    Spouse name: N/A  . Number of children: N/A  . Years of education: N/A   Occupational History  . Not on file.   Social History Main Topics  . Smoking status: Never Smoker  . Smokeless tobacco: Never Used  . Alcohol use 0.0 oz/week     Comment: occasionally  . Drug use: No  . Sexual activity: Not on file   Other Topics Concern  . Not on file   Social History Narrative   Married.    2 children.   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     Past Surgical History:  Procedure Laterality Date  . APPENDECTOMY    . COLONOSCOPY  N/A 06/08/2015   Procedure: COLONOSCOPY;  Surgeon: Mauri Pole, MD;  Location: [facility];  Service: Endoscopy;  Laterality: N/A;  BMI > 50    Family History  Problem Relation Age of Onset  . Stroke Father   . Hypertension Father   . Hypertension Mother   . Diabetes Maternal Grandfather   . Colon cancer Neg Hx     No Known Allergies  Current Outpatient Prescriptions on File Prior to Visit  Medication Sig Dispense Refill  . nystatin cream (MYCOSTATIN) Apply 1 application topically 2 (two) times daily. (Patient not taking: Reported on 12/05/2016) 60 g 1   No current facility-administered medications on file prior to visit.     BP (!) 144/90   Pulse 67   Temp 98.1 F (36.7 C) (Oral)   Ht 5\' 11"  (1.803 m)   Wt (!) 388 lb 1.9 oz (176.1 kg)   SpO2 96%   BMI 54.13 kg/m    Objective:   Physical Exam  Constitutional: He appears well-nourished.  Neck: Neck supple.  Cardiovascular: Normal rate and regular rhythm.   Pulmonary/Chest: Effort normal and breath sounds normal.  Skin: Skin is warm and dry.  Psychiatric: He has a normal mood and affect.  Assessment & Plan:

## 2016-12-05 NOTE — Assessment & Plan Note (Signed)
Rate and rhythm regular today. 

## 2016-12-05 NOTE — Assessment & Plan Note (Signed)
Check B 12 today. No injections or oral tablets for 6-8 months.

## 2016-12-05 NOTE — Progress Notes (Signed)
Pre visit review using our clinic review tool, if applicable. No additional management support is needed unless otherwise documented below in the visit note. 

## 2016-12-05 NOTE — Patient Instructions (Signed)
Complete lab work prior to leaving today. I will notify you of your results once received.   I sent refills of your medications to [facility] in [place].  Call [facility] at [phone]. Notify them that you saw Dr. Elsworth Soho for sleep study and need to get fitted for CPAP.  It's important to improve your diet by reducing consumption of fast food, fried food, processed snack foods, sugary drinks. Increase consumption of fresh vegetables and fruits, whole grains, water.  Ensure you are drinking 64 ounces of water daily.  Start exercising. You should be getting 150 minutes of moderate intensity exercise weekly.  Monitor your blood pressure at home and notify me of readings at or above 135/90 as this is too high.  It was a pleasure to see you today!

## 2016-12-05 NOTE — Assessment & Plan Note (Signed)
Slightly above goal today, just took BP meds. Will have him monitor at home and report readings at or above 135/90. BMP pending.

## 2016-12-05 NOTE — Assessment & Plan Note (Signed)
Check A1C today. Discussed the importance of a healthy diet and regular exercise in order for weight loss, and to reduce the risk of other medical diseases.

## 2016-12-05 NOTE — Assessment & Plan Note (Signed)
Never followed through for CPAP device. Phone number to [facility] provided today. Highly recommended he schedule a follow up visit.

## 2016-12-06 MED ORDER — METFORMIN HCL 500 MG PO TABS: ORAL_TABLET | ORAL | 2 refills | Status: DC

## 2016-12-06 MED ORDER — ATORVASTATIN CALCIUM 10 MG PO TABS: 10.0000 mg | ORAL_TABLET | Freq: Every day | ORAL | 2 refills | Status: DC

## 2016-12-06 NOTE — Addendum Note (Signed)
Addended by: Jearld Fenton on: 12/06/2016 02:49 PM   Modules accepted: Orders

## 2016-12-06 NOTE — Progress Notes (Signed)
Metformin and Lipitor sent to pharmacy. Repeat labs in 3 months per PCP request.

## 2016-12-07 ENCOUNTER — Telehealth: Payer: Self-pay

## 2016-12-07 NOTE — Telephone Encounter (Signed)
Pt wants to know what the BS value was; advised pt from lab result BS was 118 and normal range is 70-99. Pt voiced understanding and will start med and watch his diet.

## 2016-12-12 ENCOUNTER — Telehealth: Payer: Self-pay

## 2016-12-12 NOTE — Telephone Encounter (Signed)
Pt request meter,strips and lancets since recently dx with diabetes. Advised pt to ck with his ins co and see what diabetic meter, test strips and lancets are covered under his ins and call info back to [facility] with name of pharmacy pt wants diabetic supplies sent to; pt voiced understanding.

## 2016-12-14 ENCOUNTER — Ambulatory Visit (INDEPENDENT_AMBULATORY_CARE_PROVIDER_SITE_OTHER): Payer: BLUE CROSS/BLUE SHIELD | Admitting: *Deleted

## 2016-12-14 DIAGNOSIS — E538 Deficiency of other specified B group vitamins: Secondary | ICD-10-CM

## 2016-12-14 MED ORDER — CYANOCOBALAMIN 1000 MCG/ML IJ SOLN
1000.0000 ug | Freq: Once | INTRAMUSCULAR | Status: AC
  Administered 2016-12-14: 1000 ug via INTRAMUSCULAR

## 2017-01-17 ENCOUNTER — Telehealth: Payer: Self-pay | Admitting: Pulmonary Disease

## 2017-01-17 NOTE — Telephone Encounter (Signed)
Printed off hst order & gave to [facility] to precert.  Called pt & made him aware we will precert hst & then one of [place] will call him to schedule.  Titration study will not be scheduled until pt has hst.  Nothing further needed.

## 2017-01-17 NOTE — Telephone Encounter (Signed)
pts wife is calling back wanting to schedule the HST and the cpap titration tests.  The orders are still in the computer as future.  Will forward to the [facility] pool so these can be scheduled for the pt.  Thanks

## 2017-01-18 ENCOUNTER — Ambulatory Visit (INDEPENDENT_AMBULATORY_CARE_PROVIDER_SITE_OTHER): Payer: BLUE CROSS/BLUE SHIELD

## 2017-01-18 DIAGNOSIS — E538 Deficiency of other specified B group vitamins: Secondary | ICD-10-CM | POA: Diagnosis not present

## 2017-01-18 MED ORDER — CYANOCOBALAMIN 1000 MCG/ML IJ SOLN
1000.0000 ug | Freq: Once | INTRAMUSCULAR | Status: AC
  Administered 2017-01-18: 1000 ug via INTRAMUSCULAR

## 2017-02-06 DIAGNOSIS — G4733 Obstructive sleep apnea (adult) (pediatric): Secondary | ICD-10-CM | POA: Diagnosis not present

## 2017-02-15 ENCOUNTER — Telehealth: Payer: Self-pay | Admitting: Pulmonary Disease

## 2017-02-15 ENCOUNTER — Other Ambulatory Visit: Payer: Self-pay | Admitting: *Deleted

## 2017-02-15 DIAGNOSIS — G4733 Obstructive sleep apnea (adult) (pediatric): Secondary | ICD-10-CM

## 2017-02-15 NOTE — Telephone Encounter (Signed)
Per RA, HST showed moderate OSA with an average of 25 events per hour. He suggests a CPAP titration as the next step.

## 2017-02-16 NOTE — Telephone Encounter (Signed)
LMTCB 7/26

## 2017-02-21 ENCOUNTER — Ambulatory Visit (INDEPENDENT_AMBULATORY_CARE_PROVIDER_SITE_OTHER): Payer: BLUE CROSS/BLUE SHIELD | Admitting: *Deleted

## 2017-02-21 ENCOUNTER — Telehealth: Payer: Self-pay | Admitting: Pulmonary Disease

## 2017-02-21 DIAGNOSIS — E538 Deficiency of other specified B group vitamins: Secondary | ICD-10-CM

## 2017-02-21 MED ORDER — CYANOCOBALAMIN 1000 MCG/ML IJ SOLN
1000.0000 ug | Freq: Once | INTRAMUSCULAR | Status: AC
  Administered 2017-02-21: 1000 ug via INTRAMUSCULAR

## 2017-02-21 NOTE — Telephone Encounter (Signed)
Spoke with patient-he is aware of what a HST is vs CPAP titration study is and why he needs to have one done. No further questions or concerns at this time.

## 2017-02-21 NOTE — Telephone Encounter (Signed)
Pt aware of RA's recommendation's and voiced his understanding. Pt agreed to titration study. Order has been placed. Nothing further needed.

## 2017-02-21 NOTE — Telephone Encounter (Signed)
Patient is calling for results of HST. [phone].

## 2017-03-05 ENCOUNTER — Other Ambulatory Visit: Payer: Self-pay | Admitting: Internal Medicine

## 2017-03-05 ENCOUNTER — Encounter (HOSPITAL_COMMUNITY): Payer: Self-pay | Admitting: Emergency Medicine

## 2017-03-05 ENCOUNTER — Ambulatory Visit (HOSPITAL_COMMUNITY)
Admission: EM | Admit: 2017-03-05 | Discharge: 2017-03-05 | Disposition: A | Discharge status: Home / Self Care | Payer: BLUE CROSS/BLUE SHIELD | Attending: Family Medicine | Admitting: Family Medicine

## 2017-03-05 DIAGNOSIS — J069 Acute upper respiratory infection, unspecified: Secondary | ICD-10-CM

## 2017-03-05 DIAGNOSIS — E119 Type 2 diabetes mellitus without complications: Secondary | ICD-10-CM

## 2017-03-05 DIAGNOSIS — E785 Hyperlipidemia, unspecified: Secondary | ICD-10-CM

## 2017-03-05 MED ORDER — IPRATROPIUM BROMIDE 0.06 % NA SOLN: 2.0000 | Freq: Four times a day (QID) | NASAL | 0 refills | Status: DC

## 2017-03-05 MED ORDER — AZITHROMYCIN 250 MG PO TABS: 250.0000 mg | ORAL_TABLET | Freq: Every day | ORAL | 0 refills | Status: DC

## 2017-03-05 NOTE — ED Triage Notes (Signed)
The patient presented to the [facility] with a complaint of a sore throat and a cough x 1 week.

## 2017-03-05 NOTE — Discharge Instructions (Signed)
You most likely have a viral respiratory infection, this type of infection will not be helped by antibiotics.  For your symptoms I have prescribed ipratropium nasal spray. I have written a paper prescription for an antibiotic, this is so you do not have to return to clinic if your symptoms persist, but I would ask that you do no fill it for three days. You should be feeling better by then.   In addition to these therapies, I advise rest, plenty of fluids and management of symptoms with over the counter medicines. Over the counter therapies for your symptoms include:Tylenol as needed every 4-6 hours for body aches or fever, not to exceed 4,000 mg a day, Take mucinex or mucinex DM ever 12 hours with a full glass of water, you may use an inhaled steroid such as Flonase, 2 sprays each nostril once a day for congestion, or an antihistamine such as Claritin or Zyrtec once a day. Another alternative for congestion, is a pseudoephedrine containing product available from the pharmacist. Should your symptoms worsen or fail to resolve, follow up with your primary care provider or return to clinic.   Your blood pressure was high at todays visit. Avoid salt, take your medications as prescribed, keep a written log of your readings, and go to your primary care provider in 1-2 weeks if you are still trending high. Go to the ER anytime you have chest pain, weakness, blurred vision, or numbness

## 2017-03-05 NOTE — ED Provider Notes (Signed)
  [name]   [id number] 03/05/17 Arrival Time: 0488  ASSESSMENT & PLAN:  1. Viral URI     Meds ordered this encounter  Medications  . azithromycin (ZITHROMAX) 250 MG tablet    Sig: Take 1 tablet (250 mg total) by mouth daily. Take first 2 tablets together, then 1 every day until finished.    Dispense:  6 tablet    Refill:  0    Order Specific Question:   Supervising Provider    Answer:   Robyn Haber [[id number]]  . ipratropium (ATROVENT) 0.06 % nasal spray    Sig: Place 2 sprays into both nostrils 4 (four) times daily.    Dispense:  15 mL    Refill:  0    Order Specific Question:   Supervising Provider    Answer:   Robyn Haber [[id number]]   Most likely viral URI, given a delayed fill description for azithromycin, instructed not to fill this prescription for 3 days Reviewed expectations re: course of current medical issues. Questions answered. Outlined signs and symptoms indicating need for more acute intervention. Patient verbalized understanding. After Visit Summary given.   SUBJECTIVE:  Joseph Armstrong is a 56 y.o. male who presents with complaint of sinus congestion, and drainage ongoing for 1 week, denies any cough, wheezing, denies chest pain or palpitations, denies fever or malaise, no decrease in appetite, no nausea, vomiting, or diarrhea. Does not smoke, no history of lung disease. Blood pressure is elevated today, he has no unilateral weakness, no blurred vision, no chest pain, or other concerning symptoms. Reports that it is been poor, and he had a high salt meal prior to coming to the clinic, noncompliant with medications  ROS: As per HPI, remainder of ROS negative.   OBJECTIVE:  Vitals:   03/05/17 1245  BP: (!) 191/108  Pulse: 64  Resp: 20  Temp: 98.3 F (36.8 C)  TempSrc: Oral  SpO2: 100%     General appearance: alert; no distress, Morbidly obese HEENT: normocephalic; atraumatic; conjunctivae normal; no sinus tenderness, no submandibular or  submental lymphadenopathy, no tonsillar lymphadenopathy, no oropharynx erythema or edema, no tonsillar exudates, uvula is midline tympanic membranes normal Neck: No cervical lymphadenopathy Lungs: clear to auscultation bilaterally Heart: regular rate and rhythm Abdomen: soft, non-tender; Back: no CVA tenderness Extremities: no cyanosis or edema; symmetrical with no gross deformities Skin: warm and dry Neurologic: Grossly normal Psychological:  alert and cooperative; normal mood and affect  Procedures:     Labs Reviewed - No data to display  No results found.  No Known Allergies  PMHx, SurgHx, SocialHx, Medications, and Allergies were reviewed in the Visit Navigator and updated as appropriate.       Barnet Glasgow, NP 03/05/17 1313

## 2017-03-06 ENCOUNTER — Other Ambulatory Visit: Payer: Self-pay | Admitting: Primary Care

## 2017-03-06 NOTE — Telephone Encounter (Signed)
Noted, labs for A1C, lipids, hepatic function ordered.

## 2017-03-06 NOTE — Telephone Encounter (Signed)
Pt has an upcoming 3 month lab only appt, please future order labs

## 2017-03-20 ENCOUNTER — Other Ambulatory Visit (INDEPENDENT_AMBULATORY_CARE_PROVIDER_SITE_OTHER): Payer: BLUE CROSS/BLUE SHIELD

## 2017-03-20 ENCOUNTER — Other Ambulatory Visit: Payer: Self-pay | Admitting: Primary Care

## 2017-03-20 DIAGNOSIS — E785 Hyperlipidemia, unspecified: Secondary | ICD-10-CM

## 2017-03-20 DIAGNOSIS — E119 Type 2 diabetes mellitus without complications: Secondary | ICD-10-CM | POA: Diagnosis not present

## 2017-03-20 DIAGNOSIS — E538 Deficiency of other specified B group vitamins: Secondary | ICD-10-CM

## 2017-03-20 LAB — HEMOGLOBIN A1C: Hgb A1c MFr Bld: 6 % (ref 4.6–6.5)

## 2017-03-20 LAB — LIPID PANEL
Cholesterol: 138 mg/dL (ref 0–200)
HDL: 40.2 mg/dL (ref >39.00)
LDL CALC: 89 mg/dL (ref 0–99)
NonHDL: 98
TRIGLYCERIDES: 43 mg/dL (ref 0.0–149.0)
Total CHOL/HDL Ratio: 3
VLDL: 8.6 mg/dL (ref 0.0–40.0)

## 2017-03-20 LAB — HEPATIC FUNCTION PANEL
ALK PHOS: 45 U/L (ref 39–117)
ALT: 21 U/L (ref 0–53)
AST: 18 U/L (ref 0–37)
Albumin: 3.9 g/dL (ref 3.5–5.2)
BILIRUBIN DIRECT: 0.2 mg/dL (ref 0.0–0.3)
BILIRUBIN TOTAL: 0.4 mg/dL (ref 0.2–1.2)
TOTAL PROTEIN: 7.3 g/dL (ref 6.0–8.3)

## 2017-03-29 ENCOUNTER — Ambulatory Visit: Payer: BLUE CROSS/BLUE SHIELD

## 2017-04-16 ENCOUNTER — Ambulatory Visit (HOSPITAL_BASED_OUTPATIENT_CLINIC_OR_DEPARTMENT_OTHER): Payer: BLUE CROSS/BLUE SHIELD | Attending: Pulmonary Disease | Admitting: Pulmonary Disease

## 2017-04-16 VITALS — Ht 71.0 in | Wt 369.0 lb

## 2017-04-16 DIAGNOSIS — G4733 Obstructive sleep apnea (adult) (pediatric): Secondary | ICD-10-CM

## 2017-04-19 ENCOUNTER — Telehealth: Payer: Self-pay | Admitting: Pulmonary Disease

## 2017-04-19 DIAGNOSIS — G4733 Obstructive sleep apnea (adult) (pediatric): Secondary | ICD-10-CM | POA: Diagnosis not present

## 2017-04-19 NOTE — Procedures (Signed)
Patient Name: Bow, Buntyn Date: 04/16/2017 Gender: Male D.O.B: 1960-12-12 Age (years): 64 Referring Provider: Kara Mead MD, ABSM Height (inches): 71 Interpreting Physician: Kara Mead MD, ABSM Weight (lbs): 369 RPSGT: Zadie Rhine BMI: 51 MRN: [id number] Neck Size: 20.50   CLINICAL INFORMATION The patient is referred for a CPAP titration to treat sleep apnea.  Date of  HST: 01/2017, AHI 25/h  SLEEP STUDY TECHNIQUE As per the AASM Manual for the Scoring of Sleep and Associated Events v2.3 (April 2016) with a hypopnea requiring 4% desaturations.  The channels recorded and monitored were frontal, central and occipital EEG, electrooculogram (EOG), submentalis EMG (chin), nasal and oral airflow, thoracic and abdominal wall motion, anterior tibialis EMG, snore microphone, electrocardiogram, and pulse oximetry. Continuous positive airway pressure (CPAP) was initiated at the beginning of the study and titrated to treat sleep-disordered breathing.  RESPIRATORY PARAMETERS Optimal PAP Pressure (cm): 14 AHI at Optimal Pressure (/hr): 1.0 Overall Minimal O2 (%): 87.00 Supine % at Optimal Pressure (%): 100 Minimal O2 at Optimal Pressure (%): 89.0     SLEEP ARCHITECTURE The study was initiated at 10:42:38 PM and ended at 4:33:53 AM.  Sleep onset time was 30.3 minutes and the sleep efficiency was 78.6%. The total sleep time was 276.0 minutes.  The patient spent 5.62% of the night in stage N1 sleep, 73.37% in stage N2 sleep, 0.00% in stage N3 and 21.01% in REM.Stage REM latency was 152.0 minutes  Wake after sleep onset was 44.9. Alpha intrusion was absent. Supine sleep was 100.00%.  CARDIAC DATA The 2 lead EKG demonstrated sinus rhythm. The mean heart rate was 68.10 beats per minute. Other EKG findings include: PVCs.   LEG MOVEMENT DATA The total Periodic Limb Movements of Sleep (PLMS) were 76. The PLMS index was 16.52. A PLMS index of <15 is considered normal in  adults.  IMPRESSIONS - The optimal PAP pressure was 14 cm of water. - Central sleep apnea was not noted during this titration (CAI = 0.0/h). - Mild oxygen desaturations were observed during this titration (min O2 = 87.00%). - No snoring was audible during this study. - 2-lead EKG demonstrated: PVCs - Mild periodic limb movements were observed during this study. Arousals associated with PLMs were rare.   DIAGNOSIS - Obstructive Sleep Apnea (327.23 [G47.33 ICD-10])    RECOMMENDATIONS - Trial of CPAP therapy on 14 cm H2O with a Large size Fisher&Paykel Nasal Mask Eson2 mask and heated humidification. - Avoid alcohol, sedatives and other CNS depressants that may worsen sleep apnea and disrupt normal sleep architecture. - Sleep hygiene should be reviewed to assess factors that may improve sleep quality. - Weight management and regular exercise should be initiated or continued. - Return to Sleep Center for re-evaluation after 4 weeks of therapy   Kara Mead MD Board Certified in [place]

## 2017-04-19 NOTE — Telephone Encounter (Signed)
Please let him know and send prescription to DME for   CPAP  14 cm H2O with a Large size Fisher&Paykel Nasal Mask Eson2 mask and heated humidification.  Download in 4 weeks. Office visit with TP in 6 weeks

## 2017-04-21 NOTE — Telephone Encounter (Signed)
Spoke with patient. He is aware of the CPAP order. Will go ahead and order CPAP machine. Patient verbalized understanding.

## 2017-06-26 ENCOUNTER — Telehealth: Payer: Self-pay | Admitting: Primary Care

## 2017-06-26 NOTE — Telephone Encounter (Signed)
Copied from [place] [id number]. Topic: Quick Communication - See Telephone Encounter >> Jun 26, 2017 10:27 AM Burnis Medin, NT wrote: CRM for notification. See Telephone encounter for: Pt is calling because he said the medication he is taking causes cancer. Medication is hydrochlorothiazide (HYDRODIURIL) 25 MG tablet and amLODipine (NORVASC) 10 MG tablet. Pt would like a call back to see what else can he take.    06/26/17.

## 2017-06-26 NOTE — Telephone Encounter (Addendum)
Please notify patient that there is a very low risk for cancer in HCTZ, and based off of what I've read it's for doses greater than 25 mg. I didn't find anything in the literature about amlodipine causing cancer.   If he'd like to switch the HCTZ for something else, then our only option would be Losartan or Lisinopril. If he'd like to proceed then I need to see him in 2 weeks for BP follow up. Let me know what he decides.   I recommend he call his pharmacy first to see if any of his medications are on the recall list.

## 2017-06-27 NOTE — Telephone Encounter (Signed)
Spoken and notified patient of Kate's comments. Patient verbalized understanding.  He will check with his pharmacy first

## 2017-06-29 ENCOUNTER — Ambulatory Visit: Payer: BLUE CROSS/BLUE SHIELD | Admitting: Primary Care

## 2017-07-04 ENCOUNTER — Other Ambulatory Visit: Payer: Self-pay | Admitting: Primary Care

## 2017-07-04 DIAGNOSIS — I10 Essential (primary) hypertension: Secondary | ICD-10-CM

## 2017-07-05 ENCOUNTER — Telehealth: Payer: Self-pay | Admitting: Primary Care

## 2017-07-05 NOTE — Telephone Encounter (Signed)
Copied from [place]. Topic: Quick Communication - See Telephone Encounter >> Jul 05, 2017 12:15 PM Hewitt Shorts wrote: CRM for notification. See Telephone encounter for: pt is needing a refill on amlodipine, metoprolol and hydrochlorothiazide  Best number is [phone] [place] [facility] [address] [phone] 07/05/17.

## 2017-08-24 ENCOUNTER — Other Ambulatory Visit: Payer: Self-pay

## 2017-08-24 ENCOUNTER — Encounter: Payer: Self-pay | Admitting: Family Medicine

## 2017-08-24 ENCOUNTER — Telehealth: Payer: Self-pay | Admitting: *Deleted

## 2017-08-24 ENCOUNTER — Ambulatory Visit: Payer: BLUE CROSS/BLUE SHIELD | Admitting: Family Medicine

## 2017-08-24 VITALS — BP 136/90 | HR 62 | Temp 98.7°F | Ht 71.0 in | Wt 370.8 lb

## 2017-08-24 DIAGNOSIS — M545 Low back pain, unspecified: Secondary | ICD-10-CM

## 2017-08-24 DIAGNOSIS — B029 Zoster without complications: Secondary | ICD-10-CM | POA: Diagnosis not present

## 2017-08-24 DIAGNOSIS — E538 Deficiency of other specified B group vitamins: Secondary | ICD-10-CM | POA: Diagnosis not present

## 2017-08-24 LAB — POC URINALSYSI DIPSTICK (AUTOMATED)
BILIRUBIN UA: NEGATIVE
Blood, UA: NEGATIVE
GLUCOSE UA: NEGATIVE
KETONES UA: NEGATIVE
Leukocytes, UA: NEGATIVE
Nitrite, UA: NEGATIVE
Protein, UA: NEGATIVE
SPEC GRAV UA: 1.015 (ref 1.010–1.025)
Urobilinogen, UA: 0.2 E.U./dL
pH, UA: 6.5 (ref 5.0–8.0)

## 2017-08-24 MED ORDER — VALACYCLOVIR HCL 1 G PO TABS: 1000.0000 mg | ORAL_TABLET | Freq: Three times a day (TID) | ORAL | 0 refills | Status: DC

## 2017-08-24 MED ORDER — TRAMADOL HCL 50 MG PO TABS: 50.0000 mg | ORAL_TABLET | Freq: Four times a day (QID) | ORAL | 2 refills | Status: AC | PRN

## 2017-08-24 MED ORDER — CYANOCOBALAMIN 1000 MCG/ML IJ SOLN
1000.0000 ug | Freq: Once | INTRAMUSCULAR | Status: AC
  Administered 2017-08-24: 1000 ug via INTRAMUSCULAR

## 2017-08-24 NOTE — Telephone Encounter (Signed)
Received fax from [facility] stating that since patient has no history of fill for Tramadol they can only fill for #20 tablets to start and then can get new Rx for full amount if needed.  Is this Okay?

## 2017-08-24 NOTE — Progress Notes (Signed)
Dr. Frederico Hamman T. Arietta Eisenstein, MD, [facility] [place], [address] Phone: [phone] Fax: [phone]  08/24/2017  Patient: Joseph Armstrong, MRN: [id number], DOB: 1960-09-28, 57 y.o.  Primary Physician:  Pleas Koch, NP   Chief Complaint  Patient presents with  . Back Pain    Lower x 2 days   Subjective:   Lenny Fiumara is a 57 y.o. very pleasant male patient who presents with the following:  Center of th eback and to the left, some constant pain. Details cars - but does this every day.  No prior back injuries.   Patient is having back pain, primarily on the left and going around on the left flank.  He is not having any numbness, tingling, radicular symptoms, is not having any pain with urination, no blood in the urine.  On secondary questioning the patient actually is developed a rash from posterior left aspect of his back, and this is a little bit painful and a little bit itchy.  Past Medical History, Surgical History, Social History, Family History, Problem List, Medications, and Allergies have been reviewed and updated if relevant.  Patient Active Problem List   Diagnosis Date Noted  . Borderline diabetes 06/22/2015  . Candidal intertrigo 06/11/2015  . Special screening for malignant neoplasms, colon   . Atrial fibrillation ([place]) 03/10/2015  . Preventative health care 03/10/2015  . Morbid obesity with BMI of 50.0-59.9, adult ([place]) 03/10/2015  . Essential hypertension, malignant 02/05/2015  . Vitamin B12 deficiency 02/05/2015  . OSA (obstructive sleep apnea) 02/05/2015    Past Medical History:  Diagnosis Date  . Atrial fibrillation ([place])   . GERD (gastroesophageal reflux disease)   . Hypertension   . Kidney stones   . Sleep apnea     Past Surgical History:  Procedure Laterality Date  . APPENDECTOMY    . COLONOSCOPY N/A 06/08/2015   Procedure: COLONOSCOPY;  Surgeon: Mauri Pole, MD;  Location:  [facility];  Service: Endoscopy;  Laterality: N/A;  BMI > 50    Social History   Socioeconomic History  . Marital status: Married    Spouse name: Not on file  . Number of children: Not on file  . Years of education: Not on file  . Highest education level: Not on file  Social Needs  . Financial resource strain: Not on file  . Food insecurity - worry: Not on file  . Food insecurity - inability: Not on file  . Transportation needs - medical: Not on file  . Transportation needs - non-medical: Not on file  Occupational History  . Not on file  Tobacco Use  . Smoking status: Never Smoker  . Smokeless tobacco: Never Used  Substance and Sexual Activity  . Alcohol use: Yes    Alcohol/week: 0.0 oz    Comment: occasionally  . Drug use: No  . Sexual activity: Not on file  Other Topics Concern  . Not on file  Social History Narrative   Married.    2 children.   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     Family History  Problem Relation Age of Onset  . Stroke Father   . Hypertension Father   . Hypertension Mother   . Diabetes Maternal Grandfather   . Colon cancer Neg Hx     No Known Allergies  Medication list reviewed and updated in full in [facility].  GEN: No fevers, chills. Nontoxic. Primarily MSK c/o today. MSK: Detailed in the HPI GI: tolerating PO intake without difficulty Neuro: No numbness, parasthesias, or tingling associated. Otherwise the pertinent positives of the ROS are noted above.   Objective:   BP 136/90   Pulse 62   Temp 98.7 F (37.1 C) (Oral)   Ht 5\' 11"  (1.803 m)   Wt (!) 370 lb 12 oz (168.2 kg)   BMI 51.71 kg/m    GEN: Well-developed,well-nourished,in no acute distress; alert,appropriate and cooperative throughout examination HEENT: Normocephalic and atraumatic without obvious abnormalities. Ears, externally no deformities PULM: Breathing comfortably in no respiratory  distress EXT: No clubbing, cyanosis, or edema PSYCH: Normally interactive. Cooperative during the interview. Pleasant. Friendly and conversant. Not anxious or depressed appearing. Normal, full affect.  Range of motion at  the waist: Flexion: normal Extension: normal Lateral bending: normal Rotation: all normal  No echymosis or edema Rises to examination table with no difficulty Gait: non antalgic  Inspection/Deformity: N Paraspinus Tenderness: mild low back tenderness L3-s1  B Ankle Dorsiflexion (L5,4): 5/5 B Great Toe Dorsiflexion (L5,4): 5/5 Heel Walk (L5): WNL Toe Walk (S1): WNL Rise/Squat (L4): WNL  SENSORY B Medial Foot (L4): WNL B Dorsum (L5): WNL B Lateral (S1): WNL Light Touch: WNL Pinprick: WNL  REFLEXES Knee (L4): 2+ Ankle (S1): 2+  B SLR, seated: neg B SLR, supine: neg B FABER: neg B Reverse FABER: neg B Greater Troch: NT B Log Roll: neg B Stork: NT B Sciatic Notch: NT   SKIN: In a dermatomal distribution on the left side, the patient does have some vesicular lesions that are scattered in various stages of development, some of which are purple, some of which are more clear in coloration.  Radiology: Results for orders placed or performed in visit on 08/24/17  POCT Urinalysis Dipstick (Automated)  Result Value Ref Range   Color, UA yellow    Clarity, UA clear    Glucose, UA negative    Bilirubin, UA negative    Ketones, UA negative    Spec Grav, UA 1.015 1.010 - 1.025   Blood, UA negative    pH, UA 6.5 5.0 - 8.0   Protein, UA negative    Urobilinogen, UA 0.2 0.2 or 1.0 E.U./dL   Nitrite, UA negative    Leukocytes, UA Negative Negative     Assessment and Plan:   Herpes zoster without complication  Acute left-sided low back pain without sciatica - Plan: POCT Urinalysis Dipstick (Automated)  Vitamin B12 deficiency - Plan: cyanocobalamin ((VITAMIN B-12)) injection 1,000 mcg  Classic.  Take Tylenol/Acetaminophen ES (500mg ) 2 tabs by mouth  three times a day max as needed.  Motrin 600 - 800 mg recommended TID. (Over the counter Motrin, Advil, or Generic Ibuprofen 200 mg tablets. 3-4 tablets by mouth 3 times a day. This equals a prescription strength dose.)  Tramadol prn  Valtrex   Questions answered.  Follow-up: No Follow-up on file.  Meds ordered this encounter  Medications  . valACYclovir (VALTREX) 1000 MG tablet    Sig: Take 1 tablet (1,000 mg total) by mouth 3 (three) times daily.    Dispense:  21 tablet    Refill:  0  . traMADol (ULTRAM) 50 MG tablet    Sig: Take 1 tablet (50 mg total) by mouth every 6 (six) hours as needed for up to 10 days.    Dispense:  50 tablet    Refill:  2  . cyanocobalamin ((VITAMIN B-12)) injection 1,000  mcg   Orders Placed This Encounter  Procedures  . POCT Urinalysis Dipstick (Automated)    Patient Instructions  Take Tylenol/Acetaminophen ES (500mg ) 2 tabs by mouth three times a day max as needed.  Motrin 600 - 800 mg recommended TID. (Over the counter Motrin, Advil, or Generic Ibuprofen 200 mg tablets. 3-4 tablets by mouth 3 times a day. This equals a prescription strength dose.)   Take your Valcyclovir anti-viral medicine for the shingles.   If these don't work for pain, then use your tramadol (pain medicine) for pain.    Shingles Shingles is an infection that causes a painful skin rash and fluid-filled blisters. Shingles is caused by the same virus that causes chickenpox. Shingles only develops in people who:  Have had chickenpox.  Have gotten the chickenpox vaccine. (This is rare.)  The first symptoms of shingles may be itching, tingling, or pain in an area on your skin. A rash will follow in a few days or weeks. The rash is usually on one side of the body in a bandlike or beltlike pattern. Over time, the rash turns into fluid-filled blisters that break open, scab over, and dry up. Medicines may:  Help you manage pain.  Help you recover more quickly.  Help to  prevent long-term problems.  Follow these instructions at home: Medicines  Take medicines only as told by your doctor.  Apply an anti-itch or numbing cream to the affected area as told by your doctor. Blister and Rash Care  Take a cool bath or put cool compresses on the area of the rash or blisters as told by your doctor. This may help with pain and itching.  Keep your rash covered with a loose bandage (dressing). Wear loose-fitting clothing.  Keep your rash and blisters clean with mild soap and cool water or as told by your doctor.  Check your rash every day for signs of infection. These include redness, swelling, and pain that lasts or gets worse.  Do not pick your blisters.  Do not scratch your rash. General instructions  Rest as told by your doctor.  Keep all follow-up visits as told by your doctor. This is important.  Until your blisters scab over, your infection can cause chickenpox in people who have never had it or been vaccinated against it. To prevent this from happening, avoid touching other people or being around other people, especially: ? Babies. ? Pregnant women. ? Children who have eczema. ? Elderly people who have transplants. ? People who have chronic illnesses, such as leukemia or AIDS. Contact a doctor if:  Your pain does not get better with medicine.  Your pain does not get better after the rash heals.  Your rash looks infected. Signs of infection include: ? Redness. ? Swelling. ? Pain that lasts or gets worse. Get help right away if:  The rash is on your face or nose.  You have pain in your face, pain around your eye area, or loss of feeling on one side of your face.  You have ear pain or you have ringing in your ear.  You have loss of taste.  Your condition gets worse. This information is not intended to replace advice given to you by your health care provider. Make sure you discuss any questions you have with your health care  provider. Document Released: 12/28/2007 Document Revised: 03/06/2016 Document Reviewed: 04/22/2014 Elsevier Interactive Patient Education  2018 [place],  Sumner T. Jomo Forand, MD  Allergies as of 08/24/2017   No Known Allergies     Medication List        Accurate as of 08/24/17  2:37 PM. Always use your most recent med list.          amLODipine 10 MG tablet Commonly known as:  NORVASC TAKE 1 TABLET BY MOUTH ONCE DAILY   atorvastatin 10 MG tablet Commonly known as:  LIPITOR TAKE 1 TABLET BY MOUTH ONCE DAILY   cyanocobalamin 1000 MCG/ML injection Commonly known as:  (VITAMIN B-12) Inject 1,000 mcg into the muscle every 30 (thirty) days. for 60mos. First injection received 12/14/16   hydrochlorothiazide 25 MG tablet Commonly known as:  HYDRODIURIL TAKE 1 TABLET BY MOUTH ONCE DAILY   metFORMIN 500 MG tablet Commonly known as:  GLUCOPHAGE Take 1 tab PO daily x 2 weeks then increase to 1 tab BID with meals   metoprolol tartrate 50 MG tablet Commonly known as:  LOPRESSOR TAKE 1 TABLET BY MOUTH TWICE DAILY   traMADol 50 MG tablet Commonly known as:  ULTRAM Take 1 tablet (50 mg total) by mouth every 6 (six) hours as needed for up to 10 days.   valACYclovir 1000 MG tablet Commonly known as:  VALTREX Take 1 tablet (1,000 mg total) by mouth 3 (three) times daily.

## 2017-08-24 NOTE — Patient Instructions (Signed)
Take Tylenol/Acetaminophen ES (500mg ) 2 tabs by mouth three times a day max as needed.  Motrin 600 - 800 mg recommended TID. (Over the counter Motrin, Advil, or Generic Ibuprofen 200 mg tablets. 3-4 tablets by mouth 3 times a day. This equals a prescription strength dose.)   Take your Valcyclovir anti-viral medicine for the shingles.   If these don't work for pain, then use your tramadol (pain medicine) for pain.    Shingles Shingles is an infection that causes a painful skin rash and fluid-filled blisters. Shingles is caused by the same virus that causes chickenpox. Shingles only develops in people who:  Have had chickenpox.  Have gotten the chickenpox vaccine. (This is rare.)  The first symptoms of shingles may be itching, tingling, or pain in an area on your skin. A rash will follow in a few days or weeks. The rash is usually on one side of the body in a bandlike or beltlike pattern. Over time, the rash turns into fluid-filled blisters that break open, scab over, and dry up. Medicines may:  Help you manage pain.  Help you recover more quickly.  Help to prevent long-term problems.  Follow these instructions at home: Medicines  Take medicines only as told by your doctor.  Apply an anti-itch or numbing cream to the affected area as told by your doctor. Blister and Rash Care  Take a cool bath or put cool compresses on the area of the rash or blisters as told by your doctor. This may help with pain and itching.  Keep your rash covered with a loose bandage (dressing). Wear loose-fitting clothing.  Keep your rash and blisters clean with mild soap and cool water or as told by your doctor.  Check your rash every day for signs of infection. These include redness, swelling, and pain that lasts or gets worse.  Do not pick your blisters.  Do not scratch your rash. General instructions  Rest as told by your doctor.  Keep all follow-up visits as told by your doctor. This is  important.  Until your blisters scab over, your infection can cause chickenpox in people who have never had it or been vaccinated against it. To prevent this from happening, avoid touching other people or being around other people, especially: ? Babies. ? Pregnant women. ? Children who have eczema. ? Elderly people who have transplants. ? People who have chronic illnesses, such as leukemia or AIDS. Contact a doctor if:  Your pain does not get better with medicine.  Your pain does not get better after the rash heals.  Your rash looks infected. Signs of infection include: ? Redness. ? Swelling. ? Pain that lasts or gets worse. Get help right away if:  The rash is on your face or nose.  You have pain in your face, pain around your eye area, or loss of feeling on one side of your face.  You have ear pain or you have ringing in your ear.  You have loss of taste.  Your condition gets worse. This information is not intended to replace advice given to you by your health care provider. Make sure you discuss any questions you have with your health care provider. Document Released: 12/28/2007 Document Revised: 03/06/2016 Document Reviewed: 04/22/2014 Elsevier Interactive Patient Education  [employer].

## 2017-08-25 NOTE — Telephone Encounter (Signed)
This is fine 

## 2017-08-25 NOTE — Telephone Encounter (Signed)
[facility] notified okay to dispense #20 tablets per Dr. Lorelei Pont, via fax.

## 2017-09-20 ENCOUNTER — Ambulatory Visit: Payer: BLUE CROSS/BLUE SHIELD | Admitting: Primary Care

## 2017-10-03 ENCOUNTER — Ambulatory Visit (INDEPENDENT_AMBULATORY_CARE_PROVIDER_SITE_OTHER): Payer: BLUE CROSS/BLUE SHIELD | Admitting: Primary Care

## 2017-10-03 ENCOUNTER — Encounter: Payer: Self-pay | Admitting: Primary Care

## 2017-10-03 VITALS — BP 140/86 | HR 66 | Temp 97.9°F | Ht 71.0 in | Wt 372.2 lb

## 2017-10-03 DIAGNOSIS — E538 Deficiency of other specified B group vitamins: Secondary | ICD-10-CM | POA: Diagnosis not present

## 2017-10-03 DIAGNOSIS — R7303 Prediabetes: Secondary | ICD-10-CM

## 2017-10-03 DIAGNOSIS — I10 Essential (primary) hypertension: Secondary | ICD-10-CM | POA: Diagnosis not present

## 2017-10-03 LAB — VITAMIN B12: Vitamin B-12: 229 pg/mL (ref 211–911)

## 2017-10-03 LAB — HEMOGLOBIN A1C: HEMOGLOBIN A1C: 6.4 % (ref 4.6–6.5)

## 2017-10-03 MED ORDER — LISINOPRIL-HYDROCHLOROTHIAZIDE 20-25 MG PO TABS
ORAL_TABLET | ORAL | 0 refills | Status: DC
Start: 2017-10-03 — End: 2017-11-08

## 2017-10-03 MED ORDER — METOPROLOL SUCCINATE ER 50 MG PO TB24: ORAL_TABLET | ORAL | 1 refills | Status: DC

## 2017-10-03 NOTE — Assessment & Plan Note (Signed)
Doesn't appear that he's taking metformin as it's not been refilled since May 2018. Repeat A1C pending.

## 2017-10-03 NOTE — Assessment & Plan Note (Signed)
BP remains above goal in the office today despite current regimen. Continue Amlodipine. Change metoprolol tartrate to succinate as he often forgets his second dose. Add lisinopril to regimen - change to lisinopril-hctz 20-25 mg as to not add another pill.   Discussed medication changes with patient today, he verbalized understanding. Follow up in 2 weeks for re-evaluation and BMP.

## 2017-10-03 NOTE — Patient Instructions (Signed)
Stop by the lab prior to leaving today. I will notify you of your results once received.   Stop taking hydrochlorothiazide 25 mg tablets for blood pressure. Stop taking metoprolol tartrate 50 mg tablets for blood pressure.  Start taking lisinopril-hydrochlorothiazide 20-25 mg tablets for high blood pressure. Start taking metoprolol succinate ER 50 mg once daily for high blood pressure.   Start monitoring your blood pressure daily, around the same time of day, for the next 2 weeks.  Ensure that you have rested for 30 minutes prior to checking your blood pressure. Record your readings and bring them to your next visit.  Schedule a follow up visit in 2-3 weeks for re-evaluation.  It was a pleasure to see you today!

## 2017-10-03 NOTE — Assessment & Plan Note (Signed)
Compliant to monthly B 12 injections, check B12 today. Consider oral supplementation.

## 2017-10-03 NOTE — Progress Notes (Signed)
Subjective:    Patient ID: Joseph Armstrong, male    DOB: [birth year], 57 y.o.   MRN: [id number]  HPI  Joseph Armstrong is a 57 year old male who presents today for follow up.  1) Essential Hypertension: Currently managed on HCTZ 25 mg, Amlodipine 10 mg, metoprolol tartrate 50 mg BID. He's not taking his second metoprolol tablet as he often forgets. He's not checking his BP at home. He denies chest pain, shortness of breath.   BP Readings from Last 3 Encounters:  10/03/17 140/86  08/24/17 136/90  03/05/17 (!) 191/108   2) Vitamin B 12 Deficiency: Prior B 12 level of 200 in May 2018. He has been getting B 12 injections monthly since August 2018. He is due for lab testing today.  3) Prediabetes: Currently prescribed Metformin 500 mg BID, this has not been refilled since May 2018 with two extra refills. He's not sure if he's taking this. He's due for repeat A1C check today.   Review of Systems  Constitutional: Negative for fatigue.  Respiratory: Negative for shortness of breath.   Cardiovascular: Negative for chest pain.  Neurological: Negative for dizziness and headaches.       Past Medical History:  Diagnosis Date  . Atrial fibrillation ([place])   . GERD (gastroesophageal reflux disease)   . Hypertension   . Kidney stones   . Sleep apnea      Social History   Socioeconomic History  . Marital status: Married    Spouse name: Not on file  . Number of children: Not on file  . Years of education: Not on file  . Highest education level: Not on file  Social Needs  . Financial resource strain: Not on file  . Food insecurity - worry: Not on file  . Food insecurity - inability: Not on file  . Transportation needs - medical: Not on file  . Transportation needs - non-medical: Not on file  Occupational History  . Not on file  Tobacco Use  . Smoking status: Never Smoker  . Smokeless tobacco: Never Used  Substance and Sexual Activity  . Alcohol use: Yes    Alcohol/week: 0.0 oz   Comment: occasionally  . Drug use: No  . Sexual activity: Not on file  Other Topics Concern  . Not on file  Social History Narrative   Married.    2 children.   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     Past Surgical History:  Procedure Laterality Date  . APPENDECTOMY    . COLONOSCOPY N/A 06/08/2015   Procedure: COLONOSCOPY;  Surgeon: Mauri Pole, MD;  Location: [facility];  Service: Endoscopy;  Laterality: N/A;  BMI > 50    Family History  Problem Relation Age of Onset  . Stroke Father   . Hypertension Father   . Hypertension Mother   . Diabetes Maternal Grandfather   . Colon cancer Neg Hx     No Known Allergies  Current Outpatient Medications on File Prior to Visit  Medication Sig Dispense Refill  . amLODipine (NORVASC) 10 MG tablet TAKE 1 TABLET BY MOUTH ONCE DAILY 90 tablet 0  . atorvastatin (LIPITOR) 10 MG tablet TAKE 1 TABLET BY MOUTH ONCE DAILY 30 tablet 0  . cyanocobalamin (,VITAMIN B-12,) 1000 MCG/ML injection Inject 1,000 mcg into the muscle every 30 (thirty) days. for 27mos. First injection received 12/14/16    . metFORMIN (GLUCOPHAGE) 500 MG tablet Take 1 tab  PO daily x 2 weeks then increase to 1 tab BID with meals 60 tablet 2  . valACYclovir (VALTREX) 1000 MG tablet Take 1 tablet (1,000 mg total) by mouth 3 (three) times daily. 21 tablet 0   No current facility-administered medications on file prior to visit.     BP 140/86   Pulse 66   Temp 97.9 F (36.6 C) (Oral)   Ht 5\' 11"  (1.803 m)   Wt (!) 372 lb 4 oz (168.9 kg)   SpO2 97%   BMI 51.92 kg/m    Objective:   Physical Exam  Constitutional: He is oriented to person, place, and time. He appears well-nourished.  Neck: Neck supple.  Cardiovascular: Normal rate and regular rhythm.  Pulmonary/Chest: Effort normal and breath sounds normal. He has no wheezes. He has no rales.  Neurological: He is alert and oriented to person, place, and time.   Skin: Skin is warm and dry.          Assessment & Plan:

## 2017-10-05 ENCOUNTER — Other Ambulatory Visit: Payer: Self-pay | Admitting: Primary Care

## 2017-10-05 DIAGNOSIS — E119 Type 2 diabetes mellitus without complications: Secondary | ICD-10-CM

## 2017-10-05 MED ORDER — METFORMIN HCL 500 MG PO TABS: 500.0000 mg | ORAL_TABLET | Freq: Two times a day (BID) | ORAL | 3 refills | Status: DC

## 2017-10-24 ENCOUNTER — Ambulatory Visit: Payer: BLUE CROSS/BLUE SHIELD | Admitting: Primary Care

## 2017-10-24 DIAGNOSIS — Z0289 Encounter for other administrative examinations: Secondary | ICD-10-CM

## 2017-10-31 ENCOUNTER — Ambulatory Visit: Payer: BLUE CROSS/BLUE SHIELD | Admitting: Primary Care

## 2017-11-08 ENCOUNTER — Ambulatory Visit: Payer: Self-pay

## 2017-11-08 ENCOUNTER — Ambulatory Visit: Payer: BLUE CROSS/BLUE SHIELD | Admitting: Primary Care

## 2017-11-08 ENCOUNTER — Encounter: Payer: Self-pay | Admitting: Primary Care

## 2017-11-08 VITALS — BP 150/82 | HR 57 | Temp 98.6°F | Ht 71.0 in | Wt 376.0 lb

## 2017-11-08 DIAGNOSIS — I10 Essential (primary) hypertension: Secondary | ICD-10-CM | POA: Diagnosis not present

## 2017-11-08 DIAGNOSIS — R7303 Prediabetes: Secondary | ICD-10-CM | POA: Diagnosis not present

## 2017-11-08 DIAGNOSIS — I4891 Unspecified atrial fibrillation: Secondary | ICD-10-CM | POA: Diagnosis not present

## 2017-11-08 MED ORDER — LOSARTAN POTASSIUM-HCTZ 50-12.5 MG PO TABS: ORAL_TABLET | ORAL | 0 refills | Status: DC

## 2017-11-08 MED ORDER — AMLODIPINE BESYLATE 10 MG PO TABS: ORAL_TABLET | ORAL | 3 refills | Status: DC

## 2017-11-08 NOTE — Assessment & Plan Note (Signed)
No evidence of this on exam. Rate and rhythm regular.

## 2017-11-08 NOTE — Progress Notes (Signed)
Subjective:    Patient ID: Joseph Armstrong, male    DOB: [birth year], 57 y.o.   MRN: [id number]  HPI Joseph Armstrong is a 57 y.o. male who presents today with SOB. Started about 2 weeks with all the pollen. Says he easily get SOB with minimal activity like walking around. Patient has to sit down to catch his breath and usually takes 5-6 min to recover. He is concerned that his recent medication change is causing the SOB (he was recently changed to a Lisinopril/HCTZ combo pill). Denies fever, chills, chest pain, chest tightness, or ill contacts. Has not taken anything for allergies.   He says that he has been intermittently taking the Lisinopril/HCTZ. After reviewing medications with him, he is unsure what he is taking. Called pharmacy and they state he last refilled Amlodipine in mid December. After pill review he is taking correct Metoprolol and Lisinopril/HCTZ but not taking Amlodipine.   Review of Systems  Constitutional: Positive for fatigue. Negative for activity change, chills and fever.  HENT: Negative for ear pain, hearing loss, postnasal drip, rhinorrhea, sinus pressure, sinus pain, sore throat, trouble swallowing and voice change.   Eyes: Positive for redness and itching.  Respiratory: Negative for cough, chest tightness, shortness of breath and wheezing.   Cardiovascular: Negative for chest pain and palpitations.  Gastrointestinal: Negative for nausea and vomiting.  Allergic/Immunologic: Positive for environmental allergies.  Neurological: Negative for dizziness, weakness and light-headedness.      Past Medical History:  Diagnosis Date  . Atrial fibrillation ([place])   . GERD (gastroesophageal reflux disease)   . Hypertension   . Kidney stones   . Sleep apnea    Past Surgical History:  Procedure Laterality Date  . APPENDECTOMY    . COLONOSCOPY N/A 06/08/2015   Procedure: COLONOSCOPY;  Surgeon: Mauri Pole, MD;  Location: [facility];  Service: Endoscopy;  Laterality: N/A;   BMI > 50   Social History   Socioeconomic History  . Marital status: Married    Spouse name: Not on file  . Number of children: Not on file  . Years of education: Not on file  . Highest education level: Not on file  Occupational History  . Not on file  Social Needs  . Financial resource strain: Not on file  . Food insecurity:    Worry: Not on file    Inability: Not on file  . Transportation needs:    Medical: Not on file    Non-medical: Not on file  Tobacco Use  . Smoking status: Never Smoker  . Smokeless tobacco: Never Used  Substance and Sexual Activity  . Alcohol use: Yes    Alcohol/week: 0.0 oz    Comment: occasionally  . Drug use: No  . Sexual activity: Not on file  Lifestyle  . Physical activity:    Days per week: Not on file    Minutes per session: Not on file  . Stress: Not on file  Relationships  . Social connections:    Talks on phone: Not on file    Gets together: Not on file    Attends religious service: Not on file    Active member of club or organization: Not on file    Attends meetings of clubs or organizations: Not on file    Relationship status: Not on file  . Intimate partner violence:    Fear of current or ex partner: Not on file    Emotionally abused: Not on file    Physically abused:  Not on file    Forced sexual activity: Not on file  Other Topics Concern  . Not on file  Social History Narrative   Married.    2 children.   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    Family History  Problem Relation Age of Onset  . Stroke Father   . Hypertension Father   . Hypertension Mother   . Diabetes Maternal Grandfather   . Colon cancer Neg Hx    Current Outpatient Medications on File Prior to Visit  Medication Sig Dispense Refill  . atorvastatin (LIPITOR) 10 MG tablet TAKE 1 TABLET BY MOUTH ONCE DAILY 30 tablet 0  . cyanocobalamin (,VITAMIN B-12,) 1000 MCG/ML injection Inject 1,000 mcg into  the muscle every 30 (thirty) days. for 84mos. First injection received 12/14/16    . lisinopril-hydrochlorothiazide (PRINZIDE,ZESTORETIC) 20-25 MG tablet Take 1 tablet by mouth once daily for high blood pressure. 30 tablet 0  . metFORMIN (GLUCOPHAGE) 500 MG tablet Take 1 tablet (500 mg total) by mouth 2 (two) times daily with a meal. For diabetes 180 tablet 3  . amLODipine (NORVASC) 10 MG tablet TAKE 1 TABLET BY MOUTH ONCE DAILY (Patient not taking: Reported on 11/08/2017) 90 tablet 0  . metoprolol succinate (TOPROL-XL) 50 MG 24 hr tablet Take 1 tablet by mouth once daily for high blood pressure. Take with or immediately following a meal. (Patient not taking: Reported on 11/08/2017) 90 tablet 1   No current facility-administered medications on file prior to visit.     Objective:   Physical Exam  Constitutional: He appears well-nourished. No distress.  HENT:  Right Ear: Hearing, external ear and ear canal normal.  Left Ear: Hearing, external ear and ear canal normal.  Nose: Nose normal. No mucosal edema. Right sinus exhibits no maxillary sinus tenderness and no frontal sinus tenderness. Left sinus exhibits no maxillary sinus tenderness and no frontal sinus tenderness.  Mouth/Throat: Oropharynx is clear and moist and mucous membranes are normal.  Bilateral TM is dull  Eyes: Conjunctivae are normal. Right eye exhibits no discharge. Left eye exhibits no discharge.  Neck: Neck supple.  Cardiovascular: Regular rhythm and normal heart sounds. Bradycardia present. Exam reveals no gallop and no friction rub.  No murmur heard. Pulmonary/Chest: Effort normal and breath sounds normal.  Lymphadenopathy:    He has no cervical adenopathy.   BP (!) 150/82   Pulse (!) 57   Temp 98.6 F (37 C) (Oral)   Ht 5\' 11"  (1.803 m)   Wt (!) 376 lb (170.6 kg)   SpO2 97%   BMI 52.44 kg/m    BP Readings from Last 3 Encounters:  11/08/17 (!) 150/82  10/03/17 140/86  08/24/17 136/90      Assessment & Plan:    1. Essential hypertension - Follow-up in 2-4 weeks - amLODipine (NORVASC) 10 MG tablet; Take 1 tablet (10 mg total) by mouth daily. Take 1 tablet daily for high blood pressure  Dispense: 90 tablet; Refill: 0 - ECHOCARDIOGRAM COMPLETE; Future to look for any cardiac abnormalities - Stopped Lisinopril/HCTZ  And started losartan-hydrochlorothiazide (HYZAAR) 50-12.5 MG tablet; Take one tablet by mouth daily for high blood pressure.  Dispense: 30 tablet; Refill: 0  Denita Lung, RN, Adult-Geriatric Nurse Practitioner Student

## 2017-11-08 NOTE — Patient Instructions (Addendum)
For your Blood Pressure:   - Take 1 tablet of Amlodipine (Norvasc) 10mg  every morning. We have sent a refill for this prescription to your pharmacy.    - Take 1 tablet of Metoprolol Succinate (Toprol-XL) 50mg  every morning    - Stop taking the Lisinopril/Hydrochlorothiazide (HCTZ) 20-25mg  tablet.     - Start taking 1 tablet of Losartan/Hydrochlorothiazide (HCTZ) 50-12.5mg  tablet every morning  Continue monitoring your blood pressure daily, around the same time of day, for the next 2 weeks. Ensure that you have rested for 30 minutes prior to checking your blood pressure. Record your readings and bring them to your next visit.  For your Cholesterol:   - Take 1 tablet of Atorvastatin (Lipitor) 10mg  every evening.  For your Diabetes:   - Take Metformin 500 mg every morning and every evening.   For your Allergies;   - Take an Antihistamine every day. Generic medication (Zyrtec, Allegra or Claritin) is fine. Use as instructed on the manufacturer's label     Schedule a follow up visit in 2-3 weeks for re-evaluation.  If you have any questions, please call us!  It has been a pleasure seeing you today. Denita Lung, RN, Adult-Geriatric Nurse Practitioner Student and Allie Bossier, AGNP    Hydrochlorothiazide, HCTZ; Losartan tablets What is this medicine? LOSARTAN; HYDROCHLOROTHIAZIDE (loe SAR tan; hye droe klor oh THYE a zide) is a combination of a drug that relaxes blood vessels and a diuretic. It is used to treat high blood pressure. This medicine may also reduce the risk of stroke in certain patients. This medicine may be used for other purposes; ask your health care provider or pharmacist if you have questions. COMMON BRAND NAME(S): Hyzaar What should I tell my health care provider before I take this medicine? They need to know if you have any of these conditions: -decreased urine -kidney disease -liver disease -if you are on a special diet, like a low-salt diet -immune system  problems, like lupus -an unusual or allergic reaction to losartan, hydrochlorothiazide, sulfa drugs, other medicines, foods, dyes, or preservatives -pregnant or trying to get pregnant -breast-feeding How should I use this medicine? Take this medicine by mouth with a glass of water. Follow the directions on the prescription label. You can take it with or without food. If it upsets your stomach, take it with food. Take your medicine at regular intervals. Do not take it more often than directed. Do not stop taking except on your doctor's advice. Talk to your pediatrician regarding the use of this medicine in children. Special care may be needed. Overdosage: If you think you have taken too much of this medicine contact a poison control center or emergency room at once. NOTE: This medicine is only for you. Do not share this medicine with others. What if I miss a dose? If you miss a dose, take it as soon as you can. If it is almost time for your next dose, take only that dose. Do not take double or extra doses. What may interact with this medicine? -barbiturates, like phenobarbital -blood pressure medicines -celecoxib -cimetidine -corticosteroids -diabetic medicines -diuretics, especially triamterene, spironolactone or amiloride -fluconazole -lithium -NSAIDs, medicines for pain and inflammation, like ibuprofen or naproxen -potassium salts or potassium supplements -prescription pain medicines -rifampin -skeletal muscle relaxants like tubocurarine -some cholesterol-lowering medicines like cholestyramine or colestipol This list may not describe all possible interactions. Give your health care provider a list of all the medicines, herbs, non-prescription drugs, or dietary supplements you use.  Also tell them if you smoke, drink alcohol, or use illegal drugs. Some items may interact with your medicine. What should I watch for while using this medicine? Check your blood pressure regularly while you  are taking this medicine. Ask your doctor or health care professional what your blood pressure should be and when you should contact him or her. When you check your blood pressure, write down the measurements to show your doctor or health care professional. If you are taking this medicine for a long time, you must visit your health care professional for regular checks on your progress. Make sure you schedule appointments on a regular basis. You must not get dehydrated. Ask your doctor or health care professional how much fluid you need to drink a day. Check with him or her if you get an attack of severe diarrhea, nausea and vomiting, or if you sweat a lot. The loss of too much body fluid can make it dangerous for you to take this medicine. Women should inform their doctor if they wish to become pregnant or think they might be pregnant. There is a potential for serious side effects to an unborn child, particularly in the second or third trimester. Talk to your health care professional or pharmacist for more information. You may get drowsy or dizzy. Do not drive, use machinery, or do anything that needs mental alertness until you know how this drug affects you. Do not stand or sit up quickly, especially if you are an older patient. This reduces the risk of dizzy or fainting spells. Alcohol can make you more drowsy and dizzy. Avoid alcoholic drinks. This medicine may affect your blood sugar level. If you have diabetes, check with your doctor or health care professional before changing the dose of your diabetic medicine. Avoid salt substitutes unless you are told otherwise by your doctor or health care professional. Do not treat yourself for coughs, colds, or pain while you are taking this medicine without asking your doctor or health care professional for advice. Some ingredients may increase your blood pressure. What side effects may I notice from receiving this medicine? Side effects that you should report to  your doctor or health care professional as soon as possible: -allergic reactions like skin rash, itching or hives, swelling of the face, lips, or tongue -breathing problems -changes in vision -dark urine -eye pain -fast or irregular heart beat, palpitations, or chest pain -feeling faint or lightheaded -muscle cramps -persistent dry cough -redness, blistering, peeling or loosening of the skin, including inside the mouth -stomach pain -trouble passing urine or change in the amount of urine -unusual bleeding or bruising -worsened gout pain -yellowing of the eyes or skin Side effects that usually do not require medical attention (report to your doctor or health care professional if they continue or are bothersome): -change in sex drive or performance -headache This list may not describe all possible side effects. Call your doctor for medical advice about side effects. You may report side effects to FDA at 1-800-FDA-1088. Where should I keep my medicine? Keep out of the reach of children. Store at room temperature between 15 and 30 degrees C (59 and 86 degrees F). Protect from light. Keep container tightly closed. Throw away any unused medicine after the expiration date. NOTE: This sheet is a summary. It may not cover all possible information. If you have questions about this medicine, talk to your doctor, pharmacist, or health care provider.  2018 Elsevier/Gold Standard (2010-03-31 13:57:32)

## 2017-11-08 NOTE — Assessment & Plan Note (Signed)
Compliant to Metformin, continue same.

## 2017-11-08 NOTE — Telephone Encounter (Signed)
Pt. Reports he has noticed increased shortness of breath "since my doctor changed my BP medicine.I get short of breath walking around or doing other things." Pt. Admits he is not taking his medicine as ordered. Appointment made for today.       Reason for Disposition . [1] MODERATE longstanding difficulty breathing (e.g., speaks in phrases, SOB even at rest, pulse 100-120) AND [2] SAME as normal  Answer Assessment - Initial Assessment Questions 1. RESPIRATORY STATUS: "Describe your breathing?" (e.g., wheezing, shortness of breath, unable to speak, severe coughing)      Shortness of breath 2. ONSET: "When did this breathing problem begin?"      When he started his new BP medicine 3. PATTERN "Does the difficult breathing come and go, or has it been constant since it started?"      Comes and goes 4. SEVERITY: "How bad is your breathing?" (e.g., mild, moderate, severe)    - MILD: No SOB at rest, mild SOB with walking, speaks normally in sentences, can lay down, no retractions, pulse < 100.    - MODERATE: SOB at rest, SOB with minimal exertion and prefers to sit, cannot lie down flat, speaks in phrases, mild retractions, audible wheezing, pulse 100-120.    - SEVERE: Very SOB at rest, speaks in single words, struggling to breathe, sitting hunched forward, retractions, pulse > 120      Mild 5. RECURRENT SYMPTOM: "Have you had difficulty breathing before?" If so, ask: "When was the last time?" and "What happened that time?"      No 6. CARDIAC HISTORY: "Do you have any history of heart disease?" (e.g., heart attack, angina, bypass surgery, angioplasty)      No 7. LUNG HISTORY: "Do you have any history of lung disease?"  (e.g., pulmonary embolus, asthma, emphysema)     No 8. CAUSE: "What do you think is causing the breathing problem?"      Medicine or allergies 9. OTHER SYMPTOMS: "Do you have any other symptoms? (e.g., dizziness, runny nose, cough, chest pain, fever)     Slight cough 10. PREGNANCY:  "Is there any chance you are pregnant?" "When was your last menstrual period?"       No 11. TRAVEL: "Have you traveled out of the country in the last month?" (e.g., travel history, exposures)       No  Protocols used: BREATHING DIFFICULTY-A-AH

## 2017-11-08 NOTE — Progress Notes (Signed)
Subjective:    Patient ID: Joseph Armstrong, male    DOB: 1960/11/22, 57 y.o.   MRN: [id number]  HPI  Joseph Armstrong is a 57 year old male with a history of type 2 diabetes, morbid obesity, hypertension, hyperlipidemia who presents today with a chief complaint of shortness of breath.  He is currently prescribed lisinopril-HCTZ 20/25 mg, metoprolol succinate 50 mg, amlodipine 10 mg. He was last evaluated in mid March for follow up of hypertension and was noted to be hypertensive. Lisinopril 20 mg was added to his regimen of HCTZ 25 and he was instructed to follow up 2 weeks later. His metoprolol tartrate was changed to metoprolol succinate as he often forgot to take the second tablet of metoprolol tartrate.  BP Readings from Last 3 Encounters:  11/08/17 (!) 150/82  10/03/17 140/86  08/24/17 136/90   His shortness of breath began two weeks ago when the pollen began, also around the time he started lisinopril-HCTZ. He experiences dyspnea on light exertion when walking around his house, showering. He will sit and rest with resolve in shortness of breath within 4-5 minutes. He thinks this is associated to a change in his blood pressure medication. He's not sure which blood pressure medications he's taking as he did not bring his bottles. He's not checked his blood pressure recently.  He has noticed an intermittent cough with tickle to his throat. He denies chest pain, rhinorrhea, palpitations, cough, post nasal drip. He's not taken anything for his allergies.   He left and returned back with all of his medications and is not taking Amlodipine as he ran out one month ago. He's taking metoprolol succinate 50 mg and lisinopril-HCTZ.   Review of Systems  Constitutional: Negative for fatigue.  HENT: Negative for congestion, rhinorrhea and sore throat.   Respiratory:       Dyspnea upon mild exertion. Intermittent dry cough with tickle  Cardiovascular: Negative for chest pain.  Neurological: Negative for  dizziness and headaches.       Past Medical History:  Diagnosis Date  . Atrial fibrillation ([place])   . GERD (gastroesophageal reflux disease)   . Hypertension   . Kidney stones   . Sleep apnea      Social History   Socioeconomic History  . Marital status: Married    Spouse name: Not on file  . Number of children: Not on file  . Years of education: Not on file  . Highest education level: Not on file  Occupational History  . Not on file  Social Needs  . Financial resource strain: Not on file  . Food insecurity:    Worry: Not on file    Inability: Not on file  . Transportation needs:    Medical: Not on file    Non-medical: Not on file  Tobacco Use  . Smoking status: Never Smoker  . Smokeless tobacco: Never Used  Substance and Sexual Activity  . Alcohol use: Yes    Alcohol/week: 0.0 oz    Comment: occasionally  . Drug use: No  . Sexual activity: Not on file  Lifestyle  . Physical activity:    Days per week: Not on file    Minutes per session: Not on file  . Stress: Not on file  Relationships  . Social connections:    Talks on phone: Not on file    Gets together: Not on file    Attends religious service: Not on file    Active member of club or organization:  Not on file    Attends meetings of clubs or organizations: Not on file    Relationship status: Not on file  . Intimate partner violence:    Fear of current or ex partner: Not on file    Emotionally abused: Not on file    Physically abused: Not on file    Forced sexual activity: Not on file  Other Topics Concern  . Not on file  Social History Narrative   Married.    2 children.   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     Past Surgical History:  Procedure Laterality Date  . APPENDECTOMY    . COLONOSCOPY N/A 06/08/2015   Procedure: COLONOSCOPY;  Surgeon: Mauri Pole, MD;  Location: [facility];  Service: Endoscopy;  Laterality: N/A;  BMI >  50    Family History  Problem Relation Age of Onset  . Stroke Father   . Hypertension Father   . Hypertension Mother   . Diabetes Maternal Grandfather   . Colon cancer Neg Hx     No Known Allergies  Current Outpatient Medications on File Prior to Visit  Medication Sig Dispense Refill  . metFORMIN (GLUCOPHAGE) 500 MG tablet Take 1 tablet (500 mg total) by mouth 2 (two) times daily with a meal. For diabetes 180 tablet 3  . atorvastatin (LIPITOR) 10 MG tablet TAKE 1 TABLET BY MOUTH ONCE DAILY (Patient not taking: Reported on 11/08/2017) 30 tablet 0  . metoprolol succinate (TOPROL-XL) 50 MG 24 hr tablet Take 1 tablet by mouth once daily for high blood pressure. Take with or immediately following a meal. (Patient not taking: Reported on 11/08/2017) 90 tablet 1   No current facility-administered medications on file prior to visit.     BP (!) 150/82   Pulse (!) 57   Temp 98.6 F (37 C) (Oral)   Ht 5\' 11"  (1.803 m)   Wt (!) 376 lb (170.6 kg)   SpO2 97%   BMI 52.44 kg/m    Objective:   Physical Exam  Constitutional: He appears well-nourished.  HENT:  Nose: No mucosal edema.  Mouth/Throat: Oropharynx is clear and moist.  Dullness to bilateral TM's  Neck: Neck supple.  Cardiovascular: Normal rate and regular rhythm.  No murmur heard. Pulmonary/Chest: Effort normal and breath sounds normal.  Skin: Skin is warm and dry.          Assessment & Plan:  Exertional Dyspnea:  Occurs with mild exertion. Respiratory exam with dullness to TM's bilaterally, could be allergy related but lacks other cardinal symptoms. No murmur.  Given morbid obesity, hypertension, sleep apnea will send for echocardiogram to rule out any cardiac cause.  Could be secondary to uncontrolled hypertension, treating.   Pleas Koch, NP

## 2017-11-08 NOTE — Assessment & Plan Note (Signed)
Poor historian and unaware of what he's taken despite descriptive AVS instructions in the past.  Switch lisinopril-HCTZ to losartan-HCTZ given cough. Continue metoprolol succinate 50 mg, resume Amlodipine 10 mg.   Will have him start monitoring his BP and follow up in 2-3 weeks for re-evaluation.

## 2017-11-14 ENCOUNTER — Encounter: Payer: Self-pay | Admitting: Primary Care

## 2017-11-14 ENCOUNTER — Ambulatory Visit: Payer: BLUE CROSS/BLUE SHIELD | Admitting: Primary Care

## 2017-11-20 ENCOUNTER — Ambulatory Visit: Payer: BLUE CROSS/BLUE SHIELD | Admitting: Family Medicine

## 2017-11-21 ENCOUNTER — Ambulatory Visit: Payer: BLUE CROSS/BLUE SHIELD | Admitting: Primary Care

## 2017-11-23 ENCOUNTER — Other Ambulatory Visit (HOSPITAL_COMMUNITY): Payer: BLUE CROSS/BLUE SHIELD

## 2017-11-28 ENCOUNTER — Ambulatory Visit: Payer: BLUE CROSS/BLUE SHIELD | Admitting: Primary Care

## 2017-12-12 ENCOUNTER — Encounter (HOSPITAL_COMMUNITY): Payer: Self-pay | Admitting: Emergency Medicine

## 2017-12-12 ENCOUNTER — Ambulatory Visit (HOSPITAL_COMMUNITY)
Admission: EM | Admit: 2017-12-12 | Discharge: 2017-12-12 | Disposition: A | Discharge status: Home / Self Care | Payer: BLUE CROSS/BLUE SHIELD | Attending: Family Medicine | Admitting: Family Medicine

## 2017-12-12 DIAGNOSIS — M545 Low back pain, unspecified: Secondary | ICD-10-CM

## 2017-12-12 MED ORDER — IBUPROFEN 800 MG PO TABS
ORAL_TABLET | ORAL | Status: AC
  Filled 2017-12-12: qty 1

## 2017-12-12 MED ORDER — NAPROXEN 500 MG PO TABS: 500.0000 mg | ORAL_TABLET | Freq: Two times a day (BID) | ORAL | 0 refills | Status: DC

## 2017-12-12 MED ORDER — IBUPROFEN 800 MG PO TABS
800.0000 mg | ORAL_TABLET | Freq: Once | ORAL | Status: AC
  Administered 2017-12-12: 800 mg via ORAL

## 2017-12-12 NOTE — Discharge Instructions (Signed)
Naproxen twice a day, take food, take first dose tonight. Sleep with pillows under your knees.  Light and regular activity.  Please follow up with your pcp for recheck of your back and bp in the next 1-2 weeks

## 2017-12-12 NOTE — ED Triage Notes (Signed)
Pt sts lower back pain x 3 days worse when laying down

## 2017-12-12 NOTE — ED Provider Notes (Signed)
[facility]    CSN: [id number] Arrival date & time: 12/12/17  1126     History   Chief Complaint Chief Complaint  Patient presents with  . Back Pain    HPI Joseph Armstrong is a 57 y.o. male.   Joseph Armstrong presents with complaints of midline low back pain which started approximately 1 week ago. Has not worsened. Worse with laying flat. Has been able to tolerate activity and work without problem. Denies any previous similar. Took tylenol which helped. Has not taken any today. Pain 8/10. Without saddle paresthesia, denies stool or urine incontinence. Denies leg weakness,  Numbness or tingling. Denies any heavy lifting or change in activity, but states he had quite active intercourse prior to symptoms onset. Hx afib, gerd, htn, dm, osa, obesity. He states he took his BP medications shortly before arrival to clinic.     ROS per HPI.      Past Medical History:  Diagnosis Date  . Atrial fibrillation ([place])   . GERD (gastroesophageal reflux disease)   . Hypertension   . Kidney stones   . Sleep apnea     Patient Active Problem List   Diagnosis Date Noted  . Borderline diabetes 06/22/2015  . Candidal intertrigo 06/11/2015  . Special screening for malignant neoplasms, colon   . Atrial fibrillation ([place]) 03/10/2015  . Preventative health care 03/10/2015  . Morbid obesity with BMI of 50.0-59.9, adult ([place]) 03/10/2015  . Essential hypertension, malignant 02/05/2015  . Vitamin B12 deficiency 02/05/2015  . OSA (obstructive sleep apnea) 02/05/2015    Past Surgical History:  Procedure Laterality Date  . APPENDECTOMY    . COLONOSCOPY N/A 06/08/2015   Procedure: COLONOSCOPY;  Surgeon: Mauri Pole, MD;  Location: [facility];  Service: Endoscopy;  Laterality: N/A;  BMI > 50       Home Medications    Prior to Admission medications   Medication Sig Start Date End Date Taking? Authorizing Provider  amLODipine (NORVASC) 10 MG tablet Take 1 tablet by mouth once daily  for high blood pressure. 11/08/17   Pleas Koch, NP  atorvastatin (LIPITOR) 10 MG tablet TAKE 1 TABLET BY MOUTH ONCE DAILY Patient not taking: Reported on 11/08/2017 03/06/17   Pleas Koch, NP  losartan-hydrochlorothiazide (HYZAAR) 50-12.5 MG tablet Take 1 tablet by mouth daily for high blood pressure. 11/08/17   Pleas Koch, NP  metFORMIN (GLUCOPHAGE) 500 MG tablet Take 1 tablet (500 mg total) by mouth 2 (two) times daily with a meal. For diabetes 10/05/17   Pleas Koch, NP  metoprolol succinate (TOPROL-XL) 50 MG 24 hr tablet Take 1 tablet by mouth once daily for high blood pressure. Take with or immediately following a meal. Patient not taking: Reported on 11/08/2017 10/03/17   Pleas Koch, NP  naproxen (NAPROSYN) 500 MG tablet Take 1 tablet (500 mg total) by mouth 2 (two) times daily. 12/12/17   Zigmund Gottron, NP    Family History Family History  Problem Relation Age of Onset  . Stroke Father   . Hypertension Father   . Hypertension Mother   . Diabetes Maternal Grandfather   . Colon cancer Neg Hx     Social History Social History   Tobacco Use  . Smoking status: Never Smoker  . Smokeless tobacco: Never Used  Substance Use Topics  . Alcohol use: Yes    Alcohol/week: 0.0 oz    Comment: occasionally  . Drug use: No     Allergies  Patient has no known allergies.   Review of Systems Review of Systems   Physical Exam Triage Vital Signs ED Triage Vitals [12/12/17 1202]  Enc Vitals Group     BP (!) 182/81     Pulse Rate (!) 58     Resp 18     Temp 98.2 F (36.8 C)     Temp Source Oral     SpO2 97 %     Weight      Height      Head Circumference      Peak Flow      Pain Score      Pain Loc      Pain Edu?      Excl. in [place]?    No data found.  Updated Vital Signs BP (!) 182/81 (BP Location: Right Arm)   Pulse (!) 58   Temp 98.2 F (36.8 C) (Oral)   Resp 18   SpO2 97%   Visual Acuity Right Eye Distance:   Left Eye  Distance:   Bilateral Distance:    Right Eye Near:   Left Eye Near:    Bilateral Near:     Physical Exam  Constitutional: He is oriented to person, place, and time. He appears well-developed and well-nourished.  Cardiovascular: Bradycardia present.  Pulmonary/Chest: Effort normal and breath sounds normal.  Musculoskeletal:       Lumbar back: He exhibits decreased range of motion, tenderness, bony tenderness and pain. He exhibits no swelling, no edema, no deformity, no laceration, no spasm and normal pulse.       Back:  Pain with transitioning from sitting to laying as well as from laying to sitting; without pain with hip flexion or straight leg raise; midline low back pain with tenderness without step off or deformity; strength equal to bilateral lower extremities and sensation intact; mild increased pain with heel touch weight bearing; ambulatory without difficulty   Neurological: He is alert and oriented to person, place, and time.  Skin: Skin is warm and dry.     UC Treatments / Results  Labs (all labs ordered are listed, but only abnormal results are displayed) Labs Reviewed - No data to display  EKG None  Radiology No results found.  Procedures Procedures (including critical care time)  Medications Ordered in UC Medications  ibuprofen (ADVIL,MOTRIN) tablet 800 mg (has no administration in time range)    Initial Impression / Assessment and Plan / UC Course  I have reviewed the triage vital signs and the nursing notes.  Pertinent labs & imaging results that were available during my care of the patient were reviewed by me and considered in my medical decision making (see chart for details).     Obese male with low back pain. Without redflag findings on exam at this time. Tylenol has been helpful. Naproxen twice a day recommended at this time, light and regular activity. Pillow under knees. Continue to follow with PCP for long term management if symptoms persist.  Patient verbalized understanding and agreeable to plan.  Ambulatory out of clinic without difficulty.    Final Clinical Impressions(s) / UC Diagnoses   Final diagnoses:  Acute midline low back pain without sciatica     Discharge Instructions     Naproxen twice a day, take food, take first dose tonight. Sleep with pillows under your knees.  Light and regular activity.  Please follow up with your pcp for recheck of your back and bp in the next 1-2 weeks  ED Prescriptions    Medication Sig Dispense Auth. Provider   naproxen (NAPROSYN) 500 MG tablet Take 1 tablet (500 mg total) by mouth 2 (two) times daily. 30 tablet Zigmund Gottron, NP     Controlled Substance Prescriptions [place] Controlled Substance Registry consulted? Not Applicable   Zigmund Gottron, NP 12/12/17 1252

## 2017-12-13 ENCOUNTER — Telehealth: Payer: Self-pay | Admitting: Primary Care

## 2017-12-13 ENCOUNTER — Ambulatory Visit: Payer: BLUE CROSS/BLUE SHIELD | Admitting: Family Medicine

## 2017-12-13 ENCOUNTER — Other Ambulatory Visit: Payer: Self-pay | Admitting: Primary Care

## 2017-12-13 DIAGNOSIS — I10 Essential (primary) hypertension: Secondary | ICD-10-CM

## 2017-12-13 NOTE — Telephone Encounter (Signed)
Pt has upcoming appt 

## 2017-12-13 NOTE — Telephone Encounter (Deleted)
Copied from [id number]. Topic: Quick Communication - See Telephone Encounter >> Dec 13, 2017  2:13 PM Ivar Drape wrote: CRM for notification. See Telephone encounter for: 12/13/17.

## 2017-12-13 NOTE — Telephone Encounter (Signed)
Checking status also requesting amLODipine (NORVASC) 10 MG tablet and metoprolol succinate (TOPROL-XL) 50 MG 24 hr tablet

## 2017-12-25 ENCOUNTER — Inpatient Hospital Stay: Payer: BLUE CROSS/BLUE SHIELD | Admitting: Family Medicine

## 2017-12-28 ENCOUNTER — Encounter: Payer: Self-pay | Admitting: Primary Care

## 2017-12-28 ENCOUNTER — Ambulatory Visit: Payer: BLUE CROSS/BLUE SHIELD | Admitting: Primary Care

## 2017-12-28 ENCOUNTER — Encounter: Payer: Self-pay | Admitting: *Deleted

## 2017-12-28 VITALS — BP 146/82 | HR 62 | Temp 98.1°F | Ht 71.0 in | Wt 370.8 lb

## 2017-12-28 DIAGNOSIS — M545 Low back pain, unspecified: Secondary | ICD-10-CM

## 2017-12-28 DIAGNOSIS — R7303 Prediabetes: Secondary | ICD-10-CM

## 2017-12-28 DIAGNOSIS — I10 Essential (primary) hypertension: Secondary | ICD-10-CM | POA: Diagnosis not present

## 2017-12-28 LAB — COMPREHENSIVE METABOLIC PANEL
ALBUMIN: 3.9 g/dL (ref 3.5–5.2)
ALT: 16 U/L (ref 0–53)
AST: 16 U/L (ref 0–37)
Alkaline Phosphatase: 45 U/L (ref 39–117)
BUN: 14 mg/dL (ref 6–23)
CHLORIDE: 102 meq/L (ref 96–112)
CO2: 30 meq/L (ref 19–32)
Calcium: 9.3 mg/dL (ref 8.4–10.5)
Creatinine, Ser: 1.13 mg/dL (ref 0.40–1.50)
GFR: 85.93 mL/min (ref >60.00)
Glucose, Bld: 109 mg/dL — ABNORMAL HIGH (ref 70–99)
POTASSIUM: 4.1 meq/L (ref 3.5–5.1)
SODIUM: 138 meq/L (ref 135–145)
Total Bilirubin: 0.4 mg/dL (ref 0.2–1.2)
Total Protein: 7.4 g/dL (ref 6.0–8.3)

## 2017-12-28 LAB — HEMOGLOBIN A1C: HEMOGLOBIN A1C: 6.2 % (ref 4.6–6.5)

## 2017-12-28 MED ORDER — LOSARTAN POTASSIUM-HCTZ 100-25 MG PO TABS: 1.0000 | ORAL_TABLET | Freq: Every day | ORAL | 0 refills | Status: DC

## 2017-12-28 NOTE — Assessment & Plan Note (Signed)
Repeat A1C pending today. Commended him on weight loss.

## 2017-12-28 NOTE — Patient Instructions (Signed)
Stop by the lab prior to leaving today. I will notify you of your results once received.   We've increased your blood pressure medication called losartan-hydrochlorothiazide to 100-25 mg.   You currently have losartan-hydrochlorothiazide 50-12.5 mg. You may take two of these tablets once daily until your bottle is empty, but you'll need to pick up the new prescription from the pharmacy. Only take one tablet once daily when you pick it up from the pharmacy.  Continue taking metoprolol succinate 50 mg and amlodipine 10 mg for blood pressure.  Continue taking Metformin tablets for diabetes.  You should be taking atorvastatin medication for cholesterol and to prevent heart disease/stroke.  Work on back exercises/stretching for back pain. You may take the naproxen 500 mg tablets twice daily as needed for pain.  Apply a heating pack to your lower back for improvement in pain.  You must work on weight loss through a healthy diet and exercise as discussed.  Continue to monitor your blood pressure, we will call for readings in two weeks.  It was a pleasure to see you today!   Back Exercises If you have pain in your back, do these exercises 2-3 times each day or as told by your doctor. When the pain goes away, do the exercises once each day, but repeat the steps more times for each exercise (do more repetitions). If you do not have pain in your back, do these exercises once each day or as told by your doctor. Exercises Single Knee to Chest  Do these steps 3-5 times in a row for each leg: 1. Lie on your back on a firm bed or the floor with your legs stretched out. 2. Bring one knee to your chest. 3. Hold your knee to your chest by grabbing your knee or thigh. 4. Pull on your knee until you feel a gentle stretch in your lower back. 5. Keep doing the stretch for 10-30 seconds. 6. Slowly let go of your leg and straighten it.  Pelvic Tilt  Do these steps 5-10 times in a row: 1. Lie on your  back on a firm bed or the floor with your legs stretched out. 2. Bend your knees so they point up to the ceiling. Your feet should be flat on the floor. 3. Tighten your lower belly (abdomen) muscles to press your lower back against the floor. This will make your tailbone point up to the ceiling instead of pointing down to your feet or the floor. 4. Stay in this position for 5-10 seconds while you gently tighten your muscles and breathe evenly.  Cat-Cow  Do these steps until your lower back bends more easily: 1. Get on your hands and knees on a firm surface. Keep your hands under your shoulders, and keep your knees under your hips. You may put padding under your knees. 2. Let your head hang down, and make your tailbone point down to the floor so your lower back is round like the back of a cat. 3. Stay in this position for 5 seconds. 4. Slowly lift your head and make your tailbone point up to the ceiling so your back hangs low (sags) like the back of a cow. 5. Stay in this position for 5 seconds.  Press-Ups  Do these steps 5-10 times in a row: 1. Lie on your belly (face-down) on the floor. 2. Place your hands near your head, about shoulder-width apart. 3. While you keep your back relaxed and keep your hips on the floor,  slowly straighten your arms to raise the top half of your body and lift your shoulders. Do not use your back muscles. To make yourself more comfortable, you may change where you place your hands. 4. Stay in this position for 5 seconds. 5. Slowly return to lying flat on the floor.  Bridges  Do these steps 10 times in a row: 1. Lie on your back on a firm surface. 2. Bend your knees so they point up to the ceiling. Your feet should be flat on the floor. 3. Tighten your butt muscles and lift your butt off of the floor until your waist is almost as high as your knees. If you do not feel the muscles working in your butt and the back of your thighs, slide your feet 1-2 inches  farther away from your butt. 4. Stay in this position for 3-5 seconds. 5. Slowly lower your butt to the floor, and let your butt muscles relax.  If this exercise is too easy, try doing it with your arms crossed over your chest. Belly Crunches  Do these steps 5-10 times in a row: 1. Lie on your back on a firm bed or the floor with your legs stretched out. 2. Bend your knees so they point up to the ceiling. Your feet should be flat on the floor. 3. Cross your arms over your chest. 4. Tip your chin a little bit toward your chest but do not bend your neck. 5. Tighten your belly muscles and slowly raise your chest just enough to lift your shoulder blades a tiny bit off of the floor. 6. Slowly lower your chest and your head to the floor.  Back Lifts Do these steps 5-10 times in a row: 1. Lie on your belly (face-down) with your arms at your sides, and rest your forehead on the floor. 2. Tighten the muscles in your legs and your butt. 3. Slowly lift your chest off of the floor while you keep your hips on the floor. Keep the back of your head in line with the curve in your back. Look at the floor while you do this. 4. Stay in this position for 3-5 seconds. 5. Slowly lower your chest and your face to the floor.  Contact a doctor if:  Your back pain gets a lot worse when you do an exercise.  Your back pain does not lessen 2 hours after you exercise. If you have any of these problems, stop doing the exercises. Do not do them again unless your doctor says it is okay. Get help right away if:  You have sudden, very bad back pain. If this happens, stop doing the exercises. Do not do them again unless your doctor says it is okay. This information is not intended to replace advice given to you by your health care provider. Make sure you discuss any questions you have with your health care provider. Document Released: 08/13/2010 Document Revised: 12/17/2015 Document Reviewed: 09/04/2014 Elsevier  Interactive Patient Education  [employer].

## 2017-12-28 NOTE — Progress Notes (Signed)
Subjective:    Patient ID: Joseph Armstrong, male    DOB: 1961-02-02, 57 y.o.   MRN: [id number]  HPI  Joseph Armstrong is a 57 year old male who presents today for follow up of hypertension.  He was last evaluated in mid April 2019 with uncontrolled hypertension and needing for medication management due to confusion of his prescribed regimen. Hew as switched to losartan-HCTZ given cough, continued on metoprolol succinate 50 mg, and re-initiated on Amlodipine 10 mg.   Since his last visit he's compliant to his medications. He's checking his BP at home which is running 140's/80's. He's been eating more vegetables and is trying to start walking, but he's been unable to walk lately as he's been experiencing low back pain.   Wt Readings from Last 3 Encounters:  12/28/17 (!) 370 lb 12 oz (168.2 kg)  11/08/17 (!) 376 lb (170.6 kg)  10/03/17 (!) 372 lb 4 oz (168.9 kg)     BP Readings from Last 3 Encounters:  12/28/17 (!) 146/82  12/12/17 (!) 182/81  11/08/17 (!) 150/82    2) Acute Back Pain: His main concern today is his acute back pain. His pain is located to the right lower back, sometimes moves to the mid back, which has been going on for the last 3 weeks. He denies numbness/tingling or pain to his extremities, trauma/injury. He was evaluated at the Urgent Care in late May and had a benign exam. He was prescribed Naproxen for which he's taken with improvement.  Review of Systems  Eyes: Negative for visual disturbance.  Respiratory: Negative for shortness of breath.   Cardiovascular: Negative for chest pain.  Musculoskeletal: Positive for back pain.  Neurological: Negative for dizziness, weakness and numbness.       Past Medical History:  Diagnosis Date  . Atrial fibrillation ([place])   . GERD (gastroesophageal reflux disease)   . Hypertension   . Kidney stones   . Sleep apnea      Social History   Socioeconomic History  . Marital status: Married    Spouse name: Not on file  . Number  of children: Not on file  . Years of education: Not on file  . Highest education level: Not on file  Occupational History  . Not on file  Social Needs  . Financial resource strain: Not on file  . Food insecurity:    Worry: Not on file    Inability: Not on file  . Transportation needs:    Medical: Not on file    Non-medical: Not on file  Tobacco Use  . Smoking status: Never Smoker  . Smokeless tobacco: Never Used  Substance and Sexual Activity  . Alcohol use: Yes    Alcohol/week: 0.0 oz    Comment: occasionally  . Drug use: No  . Sexual activity: Not on file  Lifestyle  . Physical activity:    Days per week: Not on file    Minutes per session: Not on file  . Stress: Not on file  Relationships  . Social connections:    Talks on phone: Not on file    Gets together: Not on file    Attends religious service: Not on file    Active member of club or organization: Not on file    Attends meetings of clubs or organizations: Not on file    Relationship status: Not on file  . Intimate partner violence:    Fear of current or ex partner: Not on file  Emotionally abused: Not on file    Physically abused: Not on file    Forced sexual activity: Not on file  Other Topics Concern  . Not on file  Social History Narrative   Married.    2 children.   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     Past Surgical History:  Procedure Laterality Date  . APPENDECTOMY    . COLONOSCOPY N/A 06/08/2015   Procedure: COLONOSCOPY;  Surgeon: Mauri Pole, MD;  Location: [facility];  Service: Endoscopy;  Laterality: N/A;  BMI > 50    Family History  Problem Relation Age of Onset  . Stroke Father   . Hypertension Father   . Hypertension Mother   . Diabetes Maternal Grandfather   . Colon cancer Neg Hx     No Known Allergies  Current Outpatient Medications on File Prior to Visit  Medication Sig Dispense Refill  . amLODipine  (NORVASC) 10 MG tablet Take 1 tablet by mouth once daily for high blood pressure. 90 tablet 3  . metFORMIN (GLUCOPHAGE) 500 MG tablet Take 1 tablet (500 mg total) by mouth 2 (two) times daily with a meal. For diabetes 180 tablet 3  . metoprolol succinate (TOPROL-XL) 50 MG 24 hr tablet Take 1 tablet by mouth once daily for high blood pressure. Take with or immediately following a meal. 90 tablet 1  . naproxen (NAPROSYN) 500 MG tablet Take 1 tablet (500 mg total) by mouth 2 (two) times daily. 30 tablet 0  . atorvastatin (LIPITOR) 10 MG tablet TAKE 1 TABLET BY MOUTH ONCE DAILY (Patient not taking: Reported on 11/08/2017) 30 tablet 0   No current facility-administered medications on file prior to visit.     BP (!) 146/82   Pulse 62   Temp 98.1 F (36.7 C) (Oral)   Ht 5\' 11"  (1.803 m)   Wt (!) 370 lb 12 oz (168.2 kg)   SpO2 98%   BMI 51.71 kg/m    Objective:   Physical Exam  Constitutional: He appears well-nourished.  Neck: Neck supple.  Cardiovascular: Normal rate and regular rhythm.  Respiratory: Effort normal and breath sounds normal.  Musculoskeletal:       Back:  5/5 strength to bilateral lower extremities. Ambulates well in clinic.  Skin: Skin is warm and dry.           Assessment & Plan:  Acute Back Pain:  Present to the right lower back x 3 weeks. Exam today overall benign, he ambulates well in the clinic, good ROM. Strongly advised continued efforts towards weight loss. Continue Naproxen PRN. Discussed stretching exercises, heat/ice. Suspect arthritic cause.  He will update if pain persists, will obtain imaging and consider PT at that point.   Pleas Koch, NP

## 2017-12-28 NOTE — Assessment & Plan Note (Signed)
BP above goal in the office today, also on home readings. Increase Losartan-HCTZ to 100-25 mg. Continue Toprol and Amlodipine.   Strongly advised he continue to work on weight loss through diet and exercise, commended him on walking.  CMP pending today. Will check on BP readings in 2 weeks, patient will monitor.

## 2018-01-02 ENCOUNTER — Telehealth: Payer: Self-pay | Admitting: Primary Care

## 2018-01-02 NOTE — Telephone Encounter (Signed)
Pt returned call for lab results. Results given with recommendations. Pt voiced understanding.

## 2018-01-11 ENCOUNTER — Telehealth: Payer: Self-pay | Admitting: Primary Care

## 2018-01-11 NOTE — Telephone Encounter (Signed)
-----   Message from Pleas Koch, NP sent at 12/28/2017  7:43 AM EDT ----- Regarding: BP Will you call patient to check on BP readings since we increased his losartan-HCTZ to 100-25 mg?

## 2018-01-11 NOTE — Telephone Encounter (Signed)
Per DPR, left detail message of Kate Clark's comments for patient to call back 

## 2018-01-16 NOTE — Telephone Encounter (Signed)
Per DPR, left detail message of Kate Clark's comments for patient to call back 

## 2018-01-20 ENCOUNTER — Other Ambulatory Visit: Payer: Self-pay | Admitting: Primary Care

## 2018-01-20 DIAGNOSIS — I10 Essential (primary) hypertension: Secondary | ICD-10-CM

## 2018-01-23 ENCOUNTER — Telehealth: Payer: Self-pay | Admitting: Primary Care

## 2018-01-23 NOTE — Telephone Encounter (Signed)
Noted refill on Toprol on 10/03/17; #90; RF x 1.  Phone call to pt. to discuss his needs.  Reported he found a new bottle, since he called earlier, and does not need a refill, afterall.

## 2018-01-23 NOTE — Telephone Encounter (Signed)
Copied from [place] [id number]. Topic: Quick Communication - Rx Refill/Question >> Jan 23, 2018  7:55 AM Celedonio Savage L wrote: Medication: metoprolol succinate (TOPROL-XL) 50 MG 24 hr tablet  Has the patient contacted their pharmacy? Yes.   (Agent: If no, request that the patient contact the pharmacy for the refill.)   (Agent: If yes, when and what did the pharmacy advise?)  was advised to call provider that pt didn't have anymore refills on bottle   Preferred Pharmacy (with phone number or street name):   [facility], [place] [phone] (Phone) [phone] (Fax)      Agent: Please be advised that RX refills may take up to 3 business days. We ask that you follow-up with your pharmacy.

## 2018-04-28 ENCOUNTER — Other Ambulatory Visit: Payer: Self-pay | Admitting: Primary Care

## 2018-04-28 DIAGNOSIS — I10 Essential (primary) hypertension: Secondary | ICD-10-CM

## 2018-05-25 ENCOUNTER — Other Ambulatory Visit: Payer: Self-pay | Admitting: Primary Care

## 2018-05-25 DIAGNOSIS — I10 Essential (primary) hypertension: Secondary | ICD-10-CM

## 2018-05-25 NOTE — Telephone Encounter (Signed)
Pt is out of metoprolol and request refill to [facility]. Pt last seen 12/28/17. Refilled per protocol # 90 x 1 pt voiced understanding and appreciated.

## 2018-09-13 ENCOUNTER — Encounter: Payer: Self-pay | Admitting: Gastroenterology

## 2018-10-18 ENCOUNTER — Other Ambulatory Visit: Payer: Self-pay | Admitting: Primary Care

## 2018-10-18 DIAGNOSIS — I10 Essential (primary) hypertension: Secondary | ICD-10-CM

## 2019-01-21 ENCOUNTER — Encounter: Payer: Self-pay | Admitting: Family Medicine

## 2019-01-21 ENCOUNTER — Other Ambulatory Visit: Payer: Self-pay

## 2019-01-21 ENCOUNTER — Ambulatory Visit (INDEPENDENT_AMBULATORY_CARE_PROVIDER_SITE_OTHER): Payer: Self-pay | Admitting: Family Medicine

## 2019-01-21 VITALS — BP 170/92 | HR 63 | Temp 97.9°F | Ht 71.0 in | Wt 375.0 lb

## 2019-01-21 DIAGNOSIS — M170 Bilateral primary osteoarthritis of knee: Secondary | ICD-10-CM

## 2019-01-21 DIAGNOSIS — R0982 Postnasal drip: Secondary | ICD-10-CM

## 2019-01-21 NOTE — Progress Notes (Signed)
Joseph Manolis T. Tevis Conger, MD Primary Care and [place] at [facility] Joseph [place], [id number] Phone: [phone]  FAX: [phone]  Joseph Armstrong - 58 y.o. male  MRN [id number]  Date of Birth: Apr 17, 1961  Visit Date: 01/21/2019  PCP: Pleas Koch, NP  Referred by: Pleas Koch, NP  Chief Complaint  Patient presents with  . Knee Pain    Bilateral   Subjective:   Joseph Armstrong is a 58 y.o. 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 Doesn't not really like to take medication.  Once a month.  Affecting the way that he hurts.  Body mass index is 52.3 kg/m.  He has been having his whole life, and he was up to 400 pounds for quite a while, and he has been over 300 pounds for 20 years.  Currently he is having bilateral knee pain, but the left is worse than the right.  He does not really take any medicine, he takes some Tylenol about 1 time per month.  Otherwise, he does have some questions about Throat clearing  Past Medical History, Surgical History, Social History, Family History, Problem List, Medications, and Allergies have been reviewed and updated if relevant.  Patient Active Problem List   Diagnosis Date Noted  . Borderline diabetes 06/22/2015  . Candidal intertrigo 06/11/2015  . Special screening for malignant neoplasms, colon   . Atrial fibrillation ([place]) 03/10/2015  . Preventative health care 03/10/2015  . Morbid obesity with BMI of 50.0-59.9, adult ([place]) 03/10/2015  . Essential hypertension, malignant 02/05/2015  . Vitamin B12 deficiency 02/05/2015  . OSA (obstructive sleep apnea) 02/05/2015    Past Medical History:  Diagnosis Date  . Atrial fibrillation ([place])   . GERD (gastroesophageal reflux disease)   . Hypertension   . Kidney stones   . Sleep apnea     Past Surgical History:  Procedure  Laterality Date  . APPENDECTOMY    . COLONOSCOPY N/A 06/08/2015   Procedure: COLONOSCOPY;  Surgeon: Mauri Pole, MD;  Location: [facility];  Service: Endoscopy;  Laterality: N/A;  BMI > 50    Social History   Socioeconomic History  . Marital status: Married    Spouse name: Not on file  . Number of children: Not on file  . Years of education: Not on file  . Highest education level: Not on file  Occupational History  . Not on file  Social Needs  . Financial resource strain: Not on file  . Food insecurity    Worry: Not on file    Inability: Not on file  . Transportation needs    Medical: Not on file    Non-medical: Not on file  Tobacco Use  . Smoking status: Never Smoker  . Smokeless tobacco: Never Used  Substance and Sexual Activity  . Alcohol use: Yes    Alcohol/week: 0.0 standard drinks    Comment: occasionally  . Drug use: No  . Sexual activity: Not on file  Lifestyle  . Physical activity    Days per week: Not on file    Minutes per session: Not on file  . Stress: Not on file  Relationships  . Social Herbalist on phone: Not on file    Gets together: Not on file    Attends religious service: Not on file  Active member of club or organization: Not on file    Attends meetings of clubs or organizations: Not on file    Relationship status: Not on file  . Intimate partner violence    Fear of current or ex partner: Not on file    Emotionally abused: Not on file    Physically abused: Not on file    Forced sexual activity: Not on file  Other Topics Concern  . Not on file  Social History Narrative   Married.    2 children.   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     Family History  Problem Relation Age of Onset  . Stroke Father   . Hypertension Father   . Hypertension Mother   . Diabetes Maternal Grandfather   . Colon cancer Neg Hx     No Known Allergies  Medication list reviewed  and updated in full in [place].  GEN: No fevers, chills. Nontoxic. Primarily MSK c/o today. MSK: Detailed in the HPI GI: tolerating PO intake without difficulty Neuro: No numbness, parasthesias, or tingling associated. Otherwise the pertinent positives of the ROS are noted above.   Objective:   BP (!) 170/92   Pulse 63   Temp 97.9 F (36.6 C) (Temporal)   Ht 5\' 11"  (1.803 m)   Wt (!) 375 lb (170.1 kg)   BMI 52.30 kg/m    GEN: WDWN, NAD, Non-toxic, Alert & Oriented x 3 HEENT: Atraumatic, Normocephalic.  Ears and Nose: No external deformity. EXTR: No clubbing/cyanosis/edema NEURO: Normal gait.  PSYCH: Normally interactive. Conversant. Not depressed or anxious appearing.  Calm demeanor.   Knee:  B Gait: Normal heel toe pattern ROM: lacks 2 def ext, and flexion to 110 Effusion: mild on the left Echymosis or edema: none Patellar tendon NT Painful PLICA: neg Patellar grind: negative Medial and lateral patellar facet loading: negative medial and lateral joint lines: medial > lateral Mcmurray's neg Flexion-pinch pos Varus and valgus stress: stable Lachman: neg Ant and Post drawer: neg Hip abduction, IR, ER: WNL Hip flexion str: 5/5 Hip abd: 5/5 Quad: 5/5 VMO atrophy:No Hamstring concentric and eccentric: 5/5   Radiology: No results found.  Assessment and Plan:     ICD-10-CM   1. Primary osteoarthritis of both knees  M17.0   2. Post-nasal drip  R09.82    >10 minutes spent in face to face time with patient, >50% spent in counselling or coordination of care: Clinically he has bilateral knee arthritis.  For finances, no x-rays today.  Offered counseling and management of knee arthritis.  He needs to lose weight at all cost, this is by far and away the most important thing.  Body mass index is 52.3 kg/m. Needs to increase his activity level, particularly quad dominant exercises, range of motion, and strengthening.  He can always take some Tylenol, Motrin,  Aleve as needed.  Offered him an intra-articular injection today, but he declined.  Postnasal drip, most likely from allergic rhinitis, recommended Zyrtec.  I appreciate the opportunity to evaluate this very friendly patient. If you have any question regarding his care or prognosis, do not hesitate to ask.   Follow-up: prn only  No orders of the defined types were placed in this encounter.  No orders of the defined types were placed in this encounter.   Signed,  Maud Deed. Danah Reinecke, MD   Outpatient Encounter Medications as of 01/21/2019  Medication Sig  . amLODipine (  NORVASC) 10 MG tablet Take 1 tablet by mouth once daily for high blood pressure.  Marland Kitchen losartan-hydrochlorothiazide (HYZAAR) 100-25 MG tablet Take 1 tablet by mouth daily. WILL NEED APPOINTMENT FOR ANY MORE REFILLS  . metFORMIN (GLUCOPHAGE) 500 MG tablet Take 1 tablet (500 mg total) by mouth 2 (two) times daily with a meal. For diabetes  . metoprolol succinate (TOPROL-XL) 50 MG 24 hr tablet TAKE 1 TABLET BY MOUTH ONCE DAILY FOR HIGH BLOOD PRESSURE  . atorvastatin (LIPITOR) 10 MG tablet TAKE 1 TABLET BY MOUTH ONCE DAILY (Patient not taking: Reported on 11/08/2017)  . [DISCONTINUED] naproxen (NAPROSYN) 500 MG tablet Take 1 tablet (500 mg total) by mouth 2 (two) times daily. (Patient not taking: Reported on 01/21/2019)   No facility-administered encounter medications on file as of 01/21/2019.

## 2019-01-21 NOTE — Patient Instructions (Addendum)
Get some generic Certrizine (Zyrtec) over the counter once a day - for the cough.  Alleve 2 tabs by mouth two times a day over the counter: This is equal to a prescripton strength dose (GENERIC CHEAPER EQUIVALENT IS NAPROXEN SODIUM)   Take Tylenol/Acetaminophen ES (500mg ) 2 tabs by mouth three times a day max as needed.

## 2019-04-09 ENCOUNTER — Telehealth: Payer: Self-pay | Admitting: Primary Care

## 2019-04-09 ENCOUNTER — Ambulatory Visit (INDEPENDENT_AMBULATORY_CARE_PROVIDER_SITE_OTHER): Payer: Self-pay | Admitting: Family Medicine

## 2019-04-09 ENCOUNTER — Other Ambulatory Visit: Payer: Self-pay

## 2019-04-09 ENCOUNTER — Ambulatory Visit (INDEPENDENT_AMBULATORY_CARE_PROVIDER_SITE_OTHER)
Admission: RE | Admit: 2019-04-09 | Discharge: 2019-04-09 | Disposition: A | Discharge status: Home / Self Care | Payer: Self-pay | Source: Ambulatory Visit | Attending: Family Medicine | Admitting: Family Medicine

## 2019-04-09 VITALS — BP 166/104 | HR 61 | Temp 98.2°F | Wt 375.0 lb

## 2019-04-09 DIAGNOSIS — M25562 Pain in left knee: Secondary | ICD-10-CM | POA: Insufficient documentation

## 2019-04-09 MED ORDER — DICLOFENAC SODIUM 75 MG PO TBEC: 75.0000 mg | DELAYED_RELEASE_TABLET | Freq: Two times a day (BID) | ORAL | 0 refills | Status: DC

## 2019-04-09 NOTE — Telephone Encounter (Signed)
Yes.. I see his wife and daughter.

## 2019-04-09 NOTE — Telephone Encounter (Signed)
Changed PCP in Epic. Thanks.

## 2019-04-09 NOTE — Assessment & Plan Note (Signed)
Likely  OA from morbid obesity.  Less likely meniscal tear.  Eval with X-ray.  Work on weight loss.  Start NSAID diclofenac x 2 weeks.. if not improving consider referral for steroid injection. Avoid repetitive knee bending, etc.

## 2019-04-09 NOTE — Telephone Encounter (Signed)
Pt saw Dr. Diona Browner today and is wanting to make her his pcp. Is it ok with both providers to change pcp?

## 2019-04-09 NOTE — Telephone Encounter (Signed)
Fine with me, thanks. 

## 2019-04-09 NOTE — Progress Notes (Signed)
Chief Complaint  Patient presents with  . Knee Pain    left... x 6 months ago... denies injury....difficulty bearing weight    History of Present Illness: HPI  58 year old morbidly obese male  [name] presents for left knee pain.Marland Kitchen ongoing for 6 months.  Gradually worse.    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  Pain located:generalized in knee.. mainly on  Medial. Worse with: stepping off to side and starting walking, bending Better with: walking once once started.  No redness, no swelling.   No known fall or new injury. No change in activity.   Treated with:  OTC topical cream, tylenol. Helped a little.  No history of knee problems.    COVID 19 screen No recent travel or known exposure to COVID19 The patient denies respiratory symptoms of COVID 19 at this time.  The importance of social distancing was discussed today.   Review of Systems  Constitutional: Negative for chills and fever.  HENT: Negative for congestion and ear pain.   Eyes: Negative for pain and redness.  Respiratory: Negative for cough and shortness of breath.   Cardiovascular: Negative for chest pain, palpitations and leg swelling.  Gastrointestinal: Negative for abdominal pain, blood in stool, constipation, diarrhea, nausea and vomiting.  Genitourinary: Negative for dysuria.  Musculoskeletal: Negative for falls and myalgias.  Skin: Negative for rash.  Neurological: Negative for dizziness.  Psychiatric/Behavioral: Negative for depression. The patient is not nervous/anxious.       Past Medical History:  Diagnosis Date  . Atrial fibrillation ([place])   . GERD (gastroesophageal reflux disease)   . Hypertension   . Kidney stones   . Sleep apnea     reports that he has never smoked. He has never used smokeless tobacco. He reports current alcohol use. He reports that he does not use drugs.   Current Outpatient Medications:  .  amLODipine  (NORVASC) 10 MG tablet, Take 1 tablet by mouth once daily for high blood pressure., Disp: 90 tablet, Rfl: 3 .  atorvastatin (LIPITOR) 10 MG tablet, TAKE 1 TABLET BY MOUTH ONCE DAILY, Disp: 30 tablet, Rfl: 0 .  losartan-hydrochlorothiazide (HYZAAR) 100-25 MG tablet, Take 1 tablet by mouth daily. WILL NEED APPOINTMENT FOR ANY MORE REFILLS, Disp: 90 tablet, Rfl: 0 .  metFORMIN (GLUCOPHAGE) 500 MG tablet, Take 1 tablet (500 mg total) by mouth 2 (two) times daily with a meal. For diabetes, Disp: 180 tablet, Rfl: 3 .  metoprolol succinate (TOPROL-XL) 50 MG 24 hr tablet, TAKE 1 TABLET BY MOUTH ONCE DAILY FOR HIGH BLOOD PRESSURE, Disp: 90 tablet, Rfl: 1   Observations/Objective: Blood pressure (!) 166/104, pulse 61, temperature 98.2 F (36.8 C), temperature source Temporal, weight (!) 375 lb (170.1 kg), SpO2 98 %.  Physical Exam Constitutional:      Appearance: He is well-developed.  HENT:     Head: Normocephalic.     Right Ear: Hearing normal.     Left Ear: Hearing normal.     Nose: Nose normal.  Neck:     Thyroid: No thyroid mass or thyromegaly.     Vascular: No carotid bruit.     Trachea: Trachea normal.  Cardiovascular:     Rate and Rhythm: Normal rate and regular rhythm.     Pulses: Normal pulses.     Heart sounds: Heart sounds not distant. No murmur. No friction rub. No gallop.      Comments: No peripheral edema  Pulmonary:     Effort: Pulmonary effort is normal. No respiratory distress.     Breath sounds: Normal breath sounds.  Musculoskeletal:     Left knee: He exhibits decreased range of motion. He exhibits no swelling, no effusion, no bony tenderness and normal meniscus. Tenderness found. Medial joint line tenderness noted. No lateral joint line, no MCL, no LCL and no patellar tendon tenderness noted.  Skin:    General: Skin is warm and dry.     Findings: No rash.  Psychiatric:        Speech: Speech normal.        Behavior: Behavior normal.        Thought Content: Thought  content normal.      Assessment and Plan Acute pain of left knee Likely  OA from morbid obesity.  Less likely meniscal tear.  Eval with X-ray.  Work on weight loss.  Start NSAID diclofenac x 2 weeks.. if not improving consider referral for steroid injection. Avoid repetitive knee bending, etc.       Eliezer Lofts, MD

## 2019-04-09 NOTE — Patient Instructions (Addendum)
We will call you with X-ray results.  Start on an antiinflammatory: diclofenac 75 mg twice daily x 2 weeks.  Ice left knee and avoid deep knee bends, squats, twisting.  Call if pain not improving as expected.

## 2019-04-11 ENCOUNTER — Ambulatory Visit: Payer: Self-pay | Admitting: Primary Care

## 2019-05-02 ENCOUNTER — Ambulatory Visit (INDEPENDENT_AMBULATORY_CARE_PROVIDER_SITE_OTHER): Payer: Self-pay | Admitting: Family Medicine

## 2019-05-02 ENCOUNTER — Other Ambulatory Visit: Payer: Self-pay

## 2019-05-02 VITALS — BP 160/90 | HR 67 | Temp 97.9°F | Ht 71.0 in | Wt 380.0 lb

## 2019-05-02 DIAGNOSIS — M1712 Unilateral primary osteoarthritis, left knee: Secondary | ICD-10-CM

## 2019-05-02 DIAGNOSIS — I10 Essential (primary) hypertension: Secondary | ICD-10-CM

## 2019-05-02 MED ORDER — METOPROLOL SUCCINATE ER 50 MG PO TB24: ORAL_TABLET | ORAL | 0 refills | Status: DC

## 2019-05-02 MED ORDER — METHYLPREDNISOLONE ACETATE 40 MG/ML IJ SUSP
80.0000 mg | Freq: Once | INTRAMUSCULAR | Status: AC
  Administered 2019-05-02: 80 mg via INTRA_ARTICULAR

## 2019-05-02 MED ORDER — AMLODIPINE BESYLATE 10 MG PO TABS: ORAL_TABLET | ORAL | 0 refills | Status: DC

## 2019-05-02 MED ORDER — LOSARTAN POTASSIUM-HCTZ 100-25 MG PO TABS: 1.0000 | ORAL_TABLET | Freq: Every day | ORAL | 0 refills | Status: DC

## 2019-05-02 NOTE — Progress Notes (Signed)
Mccayla Shimada T. Elton Heid, MD Primary Care and [place] at [facility] [place], [address] Phone: [phone]  FAX: [phone]  Eldean Gean - 58 y.o. male  MRN [id number]  Date of Birth: 1961/05/28  Visit Date: 05/02/2019  PCP: Jinny Sanders, MD  Referred by: Jinny Sanders, MD  CC: left knee pain and OA  Subjective:   Joseph Armstrong is a 58 y.o. very pleasant male patient with Body mass index is 53 kg/m. who presents with the following:  L knee OA: He is a very nice guy who weighs 380 pounds and he presents with left-sided knee pain.  He has a known history of osteoarthritis.  He has been trying some Voltaren tablets without much significant improvement in his symptoms.  He does work on Pensions consultant all of the time.  Previously he was working on a forklift, and his knee will not bother him nearly as bad.  Working on Immunologist. Pills better for a day.   inj L knee  Past Medical History, Surgical History, Social History, Family History, Problem List, Medications, and Allergies have been reviewed and updated if relevant.  Patient Active Problem List   Diagnosis Date Noted  . Acute pain of left knee 04/09/2019  . Borderline diabetes 06/22/2015  . Candidal intertrigo 06/11/2015  . Special screening for malignant neoplasms, colon   . Atrial fibrillation ([place]) 03/10/2015  . Preventative health care 03/10/2015  . Morbid obesity with BMI of 50.0-59.9, adult ([place]) 03/10/2015  . Essential hypertension, malignant 02/05/2015  . Vitamin B12 deficiency 02/05/2015  . OSA (obstructive sleep apnea) 02/05/2015    Past Medical History:  Diagnosis Date  . Atrial fibrillation ([place])   . GERD (gastroesophageal reflux disease)   . Hypertension   . Kidney stones   . Sleep apnea     Past Surgical History:  Procedure Laterality Date  . APPENDECTOMY    . COLONOSCOPY N/A 06/08/2015   Procedure: COLONOSCOPY;  Surgeon: Mauri Pole, MD;  Location: [facility];  Service: Endoscopy;  Laterality: N/A;  BMI > 50    Social History   Socioeconomic History  . Marital status: Married    Spouse name: Not on file  . Number of children: Not on file  . Years of education: Not on file  . Highest education level: Not on file  Occupational History  . Not on file  Social Needs  . Financial resource strain: Not on file  . Food insecurity    Worry: Not on file    Inability: Not on file  . Transportation needs    Medical: Not on file    Non-medical: Not on file  Tobacco Use  . Smoking status: Never Smoker  . Smokeless tobacco: Never Used  Substance and Sexual Activity  . Alcohol use: Yes    Alcohol/week: 0.0 standard drinks    Comment: occasionally  . Drug use: No  . Sexual activity: Not on file  Lifestyle  . Physical activity    Days per week: Not on file    Minutes per session: Not on file  . Stress: Not on file  Relationships  . Social Herbalist on phone: Not on file    Gets together: Not on file    Attends religious service: Not on file    Active member of club or organization: Not on file    Attends meetings of clubs or organizations: Not on  file    Relationship status: Not on file  . Intimate partner violence    Fear of current or ex partner: Not on file    Emotionally abused: Not on file    Physically abused: Not on file    Forced sexual activity: Not on file  Other Topics Concern  . Not on file  Social History Narrative   Married.    2 children.   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     Family History  Problem Relation Age of Onset  . Stroke Father   . Hypertension Father   . Hypertension Mother   . Diabetes Maternal Grandfather   . Colon cancer Neg Hx     No Known Allergies  Medication list reviewed and updated in full in [place].  GEN: No fevers, chills. Nontoxic. Primarily MSK c/o today. MSK:  Detailed in the HPI GI: tolerating PO intake without difficulty Neuro: No numbness, parasthesias, or tingling associated. Otherwise the pertinent positives of the ROS are noted above.   Objective:   BP (!) 160/90   Pulse 67   Temp 97.9 F (36.6 C) (Temporal)   Ht 5\' 11"  (1.803 m)   Wt (!) 380 lb (172.4 kg)   SpO2 97%   BMI 53.00 kg/m    GEN: WDWN, NAD, Non-toxic, Alert & Oriented x 3 HEENT: Atraumatic, Normocephalic.  Ears and Nose: No external deformity. EXTR: No clubbing/cyanosis/edema NEURO: Normal gait.  PSYCH: Normally interactive. Conversant. Not depressed or anxious appearing.  Calm demeanor.    Full extension, flexion to 120 degrees on the left.  He does have considerable medial joint line tenderness greater than the lateral joint line.  ACL, PCL, LCL, and MCL are all stable.  Flexion pinch causes moderate pain and no significant mechanical symptoms with McMurray's.  Radiology: Dg Knee Complete 4 Views Left  Result Date: 04/09/2019 CLINICAL DATA:  left knee pain EXAM: LEFT KNEE - COMPLETE 4+ VIEW COMPARISON:  None. FINDINGS: No evidence of fracture, dislocation, or joint effusion. Tricompartmental degenerative changes including joint space loss and spurring, moderate in the medial and patellofemoral compartments. Soft tissues are unremarkable. IMPRESSION: Tricompartmental degenerative changes, moderate in the medial and patellofemoral compartments. Electronically Signed   By: Audie Pinto M.D.   On: 04/09/2019 14:36    Assessment and Plan:     ICD-10-CM   1. Primary osteoarthritis of left knee  M17.12 methylPREDNISolone acetate (DEPO-MEDROL) injection 80 mg  2. Essential hypertension  I10 amLODipine (NORVASC) 10 MG tablet  3. Essential hypertension, malignant  I10 metoprolol succinate (TOPROL-XL) 50 MG 24 hr tablet    losartan-hydrochlorothiazide (HYZAAR) 100-25 MG tablet   Patient needs refills on his blood pressure medication, so I did this as well.   Osteoarthritis in a setting of 380 pounds and BMI 53 is challenging.  I recommended that he lose weight at all cost.  Failure of other conservative management, it is reasonable to think about intra-articular steroids in the setting.  Aspiration/Injection Procedure Note Noam Arutyunyan 01-Dec-1960 Date of procedure: 05/02/2019  Procedure: Large Joint Aspiration / Injection of Knee, L Indications: Pain  Procedure Details Patient verbally consented to procedure. Risks (including potential rare risk of infection), benefits, and alternatives explained. Sterilely prepped with Chloraprep. Ethyl cholride used for anesthesia. 8 cc Lidocaine 1% mixed with 2 mL Depo-Medrol 40 mg injected using the anteromedial approach without difficulty. No complications with procedure and tolerated well. Patient had decreased  pain post-injection.  He did have increased pain response and verbal response postinjection compared to the majority of people.  After he sat comfortably for a few minutes, he was able to ambulate out of the office without any difficulty and had decreased pain Medication: 2 mL of Depo-Medrol 40 mg, equaling Depo-Medrol 80 mg total   Follow-up: No follow-ups on file.  Meds ordered this encounter  Medications  . amLODipine (NORVASC) 10 MG tablet    Sig: Take 1 tablet by mouth once daily for high blood pressure.    Dispense:  90 tablet    Refill:  0  . metoprolol succinate (TOPROL-XL) 50 MG 24 hr tablet    Sig: TAKE 1 TABLET BY MOUTH ONCE DAILY FOR HIGH BLOOD PRESSURE    Dispense:  90 tablet    Refill:  0  . losartan-hydrochlorothiazide (HYZAAR) 100-25 MG tablet    Sig: Take 1 tablet by mouth daily.    Dispense:  90 tablet    Refill:  0  . methylPREDNISolone acetate (DEPO-MEDROL) injection 80 mg   No orders of the defined types were placed in this encounter.   Signed,  Maud Deed. Yousef Huge, MD   Outpatient Encounter Medications as of 05/02/2019  Medication Sig  . amLODipine (NORVASC) 10  MG tablet Take 1 tablet by mouth once daily for high blood pressure.  Marland Kitchen atorvastatin (LIPITOR) 10 MG tablet TAKE 1 TABLET BY MOUTH ONCE DAILY  . diclofenac (VOLTAREN) 75 MG EC tablet Take 1 tablet (75 mg total) by mouth 2 (two) times daily.  Marland Kitchen losartan-hydrochlorothiazide (HYZAAR) 100-25 MG tablet Take 1 tablet by mouth daily.  . metFORMIN (GLUCOPHAGE) 500 MG tablet Take 1 tablet (500 mg total) by mouth 2 (two) times daily with a meal. For diabetes  . metoprolol succinate (TOPROL-XL) 50 MG 24 hr tablet TAKE 1 TABLET BY MOUTH ONCE DAILY FOR HIGH BLOOD PRESSURE  . [DISCONTINUED] amLODipine (NORVASC) 10 MG tablet Take 1 tablet by mouth once daily for high blood pressure.  . [DISCONTINUED] losartan-hydrochlorothiazide (HYZAAR) 100-25 MG tablet Take 1 tablet by mouth daily. WILL NEED APPOINTMENT FOR ANY MORE REFILLS  . [DISCONTINUED] metoprolol succinate (TOPROL-XL) 50 MG 24 hr tablet TAKE 1 TABLET BY MOUTH ONCE DAILY FOR HIGH BLOOD PRESSURE  . [EXPIRED] methylPREDNISolone acetate (DEPO-MEDROL) injection 80 mg    No facility-administered encounter medications on file as of 05/02/2019.

## 2019-05-03 ENCOUNTER — Encounter: Payer: Self-pay | Admitting: Family Medicine

## 2019-06-11 ENCOUNTER — Telehealth: Payer: Self-pay | Admitting: Family Medicine

## 2019-06-11 MED ORDER — DICLOFENAC SODIUM 75 MG PO TBEC: 75.0000 mg | DELAYED_RELEASE_TABLET | Freq: Two times a day (BID) | ORAL | 0 refills | Status: DC

## 2019-06-11 NOTE — Telephone Encounter (Signed)
Refill sent as instructed by Dr. Diona Browner.  Left message for Mr. Mccambridge that refill has been sent to his pharmacy as requested.

## 2019-06-11 NOTE — Telephone Encounter (Signed)
Pt called to see if he could get a refill on diclofenac  pt is out of med [facility] [address]

## 2019-06-11 NOTE — Telephone Encounter (Signed)
Okay to refill as requested.

## 2019-06-11 NOTE — Telephone Encounter (Signed)
Last office visit 05/02/2019 with Dr. Lorelei Pont for Left Knee Pain.  Last refilled 04/09/2019 for #30 with no refills.  No future appointments.

## 2019-08-09 ENCOUNTER — Other Ambulatory Visit: Payer: Self-pay

## 2019-08-09 ENCOUNTER — Encounter: Payer: Self-pay | Admitting: Family Medicine

## 2019-08-09 ENCOUNTER — Ambulatory Visit (INDEPENDENT_AMBULATORY_CARE_PROVIDER_SITE_OTHER): Payer: 59 | Admitting: Family Medicine

## 2019-08-09 ENCOUNTER — Other Ambulatory Visit: Payer: Self-pay | Admitting: Family Medicine

## 2019-08-09 ENCOUNTER — Ambulatory Visit (INDEPENDENT_AMBULATORY_CARE_PROVIDER_SITE_OTHER)
Admission: RE | Admit: 2019-08-09 | Discharge: 2019-08-09 | Disposition: A | Discharge status: Home / Self Care | Payer: 59 | Source: Ambulatory Visit | Attending: Family Medicine | Admitting: Family Medicine

## 2019-08-09 VITALS — BP 150/92 | HR 69 | Temp 97.3°F | Ht 71.0 in | Wt 361.5 lb

## 2019-08-09 DIAGNOSIS — M79605 Pain in left leg: Secondary | ICD-10-CM | POA: Diagnosis not present

## 2019-08-09 DIAGNOSIS — M79604 Pain in right leg: Secondary | ICD-10-CM

## 2019-08-09 NOTE — Patient Instructions (Addendum)
Work on weight loss and low impact exercise.  We will call with X-ray results.  Increase diclofenac to twice daily.

## 2019-08-09 NOTE — Progress Notes (Signed)
Chief Complaint  Patient presents with  . Leg Pain    Bilateral    History of Present Illness: HPI  59 year old male with atrial fibrillation, morbid obesity and diabetes presents with new onset bilateral leg pain.  Pt previously Tawni Millers patient but has tranferred care to me.  Bilateral knee OA followed by Dr. Lorelei Pont  Voltaren and OTC meds not helpful. 04/2019 Depo Medrol injected in left knee.  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   No fall or injury.Pain in bilateral legs x 2 months, worse in last few weeks.  Pain from ankles up to buttocks bilateral. No numbness.  No clear weakness, but cannot walk.  No change with leaning forward or back. No swelling in ankles   Using a cane her recently. No redness, no swelling, no fever. no incontinence.    He cannot work given, he works at [employer] and [employer].. he works on [employer]. He cannot stand for more than 30 min at a time. He is not able to work in last few days given leg pain.  He is overdue for labs eval and CPX.  This visit occurred during the SARS-CoV-2 public health emergency.  Safety protocols were in place, including screening questions prior to the visit, additional usage of staff PPE, and extensive cleaning of exam room while observing appropriate contact time as indicated for disinfecting solutions.   COVID 19 screen:  No recent travel or known exposure to COVID19 The patient denies respiratory symptoms of COVID 19 at this time. The importance of social distancing was discussed today.     Review of Systems  Constitutional: Negative for chills and fever.  HENT: Negative for congestion and ear pain.   Eyes: Negative for pain and redness.  Respiratory: Negative for cough and shortness of breath.   Cardiovascular: Negative for chest pain, palpitations and leg swelling.  Gastrointestinal: Negative for abdominal pain, blood in stool, constipation, diarrhea, nausea and vomiting.  Genitourinary: Negative for dysuria.   Musculoskeletal: Negative for falls and myalgias.  Skin: Negative for rash.  Neurological: Negative for dizziness.  Psychiatric/Behavioral: Negative for depression. The patient is not nervous/anxious.       Past Medical History:  Diagnosis Date  . Atrial fibrillation ([place])   . GERD (gastroesophageal reflux disease)   . Hypertension   . Kidney stones   . Sleep apnea     reports that he has never smoked. He has never used smokeless tobacco. He reports current alcohol use. He reports that he does not use drugs.   Current Outpatient Medications:  .  amLODipine (NORVASC) 10 MG tablet, Take 1 tablet by mouth once daily for high blood pressure., Disp: 90 tablet, Rfl: 0 .  atorvastatin (LIPITOR) 10 MG tablet, TAKE 1 TABLET BY MOUTH ONCE DAILY, Disp: 30 tablet, Rfl: 0 .  diclofenac (VOLTAREN) 75 MG EC tablet, Take 1 tablet (75 mg total) by mouth 2 (two) times daily., Disp: 30 tablet, Rfl: 0 .  losartan-hydrochlorothiazide (HYZAAR) 100-25 MG tablet, Take 1 tablet by mouth daily., Disp: 90 tablet, Rfl: 0 .  metoprolol succinate (TOPROL-XL) 50 MG 24 hr tablet, TAKE 1 TABLET BY MOUTH ONCE DAILY FOR HIGH BLOOD PRESSURE, Disp: 90 tablet, Rfl: 0 .  metFORMIN (GLUCOPHAGE) 500 MG tablet, Take 1 tablet (500 mg total) by mouth 2 (two) times daily with a meal. For diabetes (Patient not taking: Reported on 08/09/2019), Disp: 180 tablet, Rfl: 3   Observations/Objective: Blood pressure (!) 150/92, pulse 69, temperature (!) 97.3 F (36.3  C), temperature source Temporal, height 5\' 11"  (1.803 m), weight (!) 361 lb 8 oz (164 kg), SpO2 94 %.  Physical Exam Constitutional:      Appearance: He is well-developed. He is obese.  HENT:     Head: Normocephalic.     Right Ear: Hearing normal.     Left Ear: Hearing normal.     Nose: Nose normal.  Neck:     Thyroid: No thyroid mass or thyromegaly.     Vascular: No carotid bruit.     Trachea: Trachea normal.  Cardiovascular:     Rate and Rhythm: Normal rate and  regular rhythm.     Pulses: Normal pulses.     Heart sounds: Heart sounds not distant. No murmur. No friction rub. No gallop.      Comments: No peripheral edema Pulmonary:     Effort: Pulmonary effort is normal. No respiratory distress.     Breath sounds: Normal breath sounds.  Musculoskeletal:     Right knee: No swelling, deformity or effusion. Decreased range of motion. Tenderness present over the medial joint line and lateral joint line. No MCL, LCL, ACL, PCL or patellar tendon tenderness. No ACL laxity or PCL laxity. Normal meniscus and normal patellar mobility. Normal pulse.     Instability Tests: Negative medial McMurray test.     Left knee: No swelling, deformity or effusion. Decreased range of motion. Tenderness present over the medial joint line and lateral joint line. No MCL, LCL, ACL or PCL tenderness. No ACL laxity or PCL laxity.Normal meniscus and normal patellar mobility. Normal pulse.     Instability Tests: Negative medial McMurray test.     Right ankle: Normal.     Left ankle: Normal.     Right foot: Normal.     Left foot: Normal.  Skin:    General: Skin is warm and dry.     Findings: No rash.  Psychiatric:        Speech: Speech normal.        Behavior: Behavior normal.        Thought Content: Thought content normal.      Assessment and Plan Bilateral leg pain Most likely related to OA of knees, but pt endorse leg pain all over as well.. difficult historian.  Will eval lumbar X-ray to make sure no sign of spinal stenosis.  no S/S of PAD/claudication.  Morbid obesity likey primary cause of leg pain. Encouraged exercise, weight loss, healthy eating habits.   Increase to BID diclofenac. If not improving consider referral back to Ortho or further eval of back with MRI.       Eliezer Lofts, MD

## 2019-08-13 ENCOUNTER — Telehealth: Payer: Self-pay | Admitting: Family Medicine

## 2019-08-13 DIAGNOSIS — M170 Bilateral primary osteoarthritis of knee: Secondary | ICD-10-CM

## 2019-08-13 MED ORDER — TRAMADOL HCL 50 MG PO TABS: 50.0000 mg | ORAL_TABLET | Freq: Three times a day (TID) | ORAL | 0 refills | Status: AC | PRN

## 2019-08-13 NOTE — Telephone Encounter (Signed)
Left detailed message for Joseph Armstrong:  Referred to ortho. Sent in tramadol for breakthorugh pain, but can cause sleepy feeling so do not use prrior to driving, working etc. This is not a great longterm fix.. more like bandaid as this is a controlled substance.  I advised that Joseph Armstrong or Joseph Armstrong would be calling him with his Ortho appointment once they get everything set up for him.

## 2019-08-13 NOTE — Telephone Encounter (Signed)
-----   Message from Carter Kitten, [place] sent at 08/12/2019 12:52 PM EST ----- Joseph Armstrong notified as instructed by telephone.  He has already seen Dr. Lorelei Pont twice and the diclofenac is not helping at all so at this point he is agreeable to an Orthopedic referral but in the meantime he wants to know if something else can be prescribed for the pain.  Please advise.

## 2019-08-13 NOTE — Telephone Encounter (Signed)
Left message for Mr. Wieting to return my call.

## 2019-08-13 NOTE — Telephone Encounter (Signed)
Referred to ortho. Sent in tramadol for breakthorugh pain, but can cause sleepy feeling so do not use prrior to driving, working etc. This is not a great longterm fix.. more like bandaid as this is a controlled substance.

## 2019-08-14 ENCOUNTER — Telehealth: Payer: Self-pay | Admitting: Family Medicine

## 2019-08-14 NOTE — Telephone Encounter (Signed)
Called patient regarding Orthopedic Referral, left message to return call to [facility] .

## 2019-08-16 NOTE — Telephone Encounter (Signed)
Left 3 messages and left message on wifes phone for patient to call Rosaria Ferries back about Orthopedic Referral. Marga Hoots accepts [employer], waiting for patient to return my call.

## 2019-08-21 ENCOUNTER — Telehealth: Payer: Self-pay | Admitting: *Deleted

## 2019-08-21 NOTE — Telephone Encounter (Signed)
Left message for Joseph Armstrong that Vito Backers with [facility] has been trying to reach him to schedule his appointment.  I ask that he return her call at [phone] to schedule.  I ask that if he does not wish to schedule with Ortho to please call us back at [facility] to let us know so we can cancel the referral.

## 2019-09-05 ENCOUNTER — Encounter: Payer: Self-pay | Admitting: Orthopaedic Surgery

## 2019-09-05 ENCOUNTER — Ambulatory Visit (INDEPENDENT_AMBULATORY_CARE_PROVIDER_SITE_OTHER): Payer: 59 | Admitting: Orthopaedic Surgery

## 2019-09-05 ENCOUNTER — Telehealth: Payer: Self-pay

## 2019-09-05 ENCOUNTER — Other Ambulatory Visit: Payer: Self-pay

## 2019-09-05 ENCOUNTER — Ambulatory Visit (INDEPENDENT_AMBULATORY_CARE_PROVIDER_SITE_OTHER): Payer: 59

## 2019-09-05 VITALS — Ht 71.0 in | Wt 360.0 lb

## 2019-09-05 DIAGNOSIS — M170 Bilateral primary osteoarthritis of knee: Secondary | ICD-10-CM

## 2019-09-05 DIAGNOSIS — Z6841 Body Mass Index (BMI) 40.0 and over, adult: Secondary | ICD-10-CM

## 2019-09-05 NOTE — Telephone Encounter (Signed)
Please get precert for bilateral gel injections. This is Dr.Xu's patient. Thanks!

## 2019-09-05 NOTE — Progress Notes (Signed)
Office Visit Note   Patient: Joseph Armstrong           Date of Birth: September 03, 1960           MRN: [id number] Visit Date: 09/05/2019              Requested by: Jinny Sanders, MD [place],  [place] [address] PCP: Jinny Sanders, MD   Assessment & Plan: Visit Diagnoses:  1. Bilateral primary osteoarthritis of knee   2. Morbid obesity ([place])   3. Morbid obesity with BMI of 50.0-59.9, adult ([place])     Plan: Impression is advanced degenerative joint disease both knees.  Patient would like to repeat cortisone injection to the left knee.  We will get approval for viscosupplementation injection to both knees.  He will make all efforts at weight loss as definitive treatment is bilateral sequential total knee replacements.  He currently has a BMI of 50 and to get to a BMI of under 40.0 he needs to get to 285 pounds.  We will follow up with [place] once approved for gel injections.  Call with concerns or questions the meantime.  Follow-Up Instructions: Return for once approved for bilateral knee visco inj.   Orders:  Orders Placed This Encounter  Procedures  . Large Joint Inj: L knee  . XR KNEE 3 VIEW RIGHT   No orders of the defined types were placed in this encounter.     Procedures: Large Joint Inj: L knee on 09/05/2019 10:46 AM Indications: pain Details: 22 G needle, anterolateral approach      Clinical Data: No additional findings.   Subjective: Chief Complaint  Patient presents with  . Right Knee - Pain  . Left Knee - Pain    HPI Joseph Armstrong is a 59 year old gentleman who comes in today with bilateral knee pain left greater than right.  This has been ongoing for the past 6 months after starting a new job at [employer] where he does a fair amount of bending and twisting.  Pain he has is to the entire aspect of both knees.  He has locking and popping.  He has been taking tramadol and diclofenac without relief of symptoms.  He does note a previous cortisone  injection to the left knee back in October which helped for about a month.  No previous cortisone injections to the right knee.  Review of Systems as detailed in HPI.  All others reviewed and are negative.   Objective: Vital Signs: Ht 5\' 11"  (1.803 m)   Wt (!) 360 lb (163.3 kg)   BMI 50.21 kg/m   Physical Exam well-developed well-nourished gentleman no acute distress.  Alert and oriented x3.  He is morbidly obese.  Ortho Exam examination of both knees reveals no effusion.  Range of motion 0 to 100 degrees.  Medial joint line tenderness.  Ligaments are stable.  Is neurovascular intact distally.  Specialty Comments:  No specialty comments available.  Imaging: XR KNEE 3 VIEW RIGHT  Result Date: 09/05/2019 Marked tricompartmental degenerative changes worse medial patellofemoral compartments    PMFS History: Patient Active Problem List   Diagnosis Date Noted  . Acute pain of left knee 04/09/2019  . Borderline diabetes 06/22/2015  . [place] intertrigo 06/11/2015  . Special screening for malignant neoplasms, colon   . Atrial fibrillation ([place]) 03/10/2015  . Preventative health care 03/10/2015  . Morbid obesity with BMI of 50.0-59.9, adult ([place]) 03/10/2015  . Essential hypertension, malignant 02/05/2015  .  Vitamin B12 deficiency 02/05/2015  . OSA (obstructive sleep apnea) 02/05/2015   Past Medical History:  Diagnosis Date  . Atrial fibrillation ([place])   . GERD (gastroesophageal reflux disease)   . Hypertension   . Kidney stones   . Sleep apnea     Family History  Problem Relation Age of Onset  . Stroke Father   . Hypertension Father   . Hypertension Mother   . Diabetes Maternal Grandfather   . Colon cancer Neg Hx     Past Surgical History:  Procedure Laterality Date  . APPENDECTOMY    . COLONOSCOPY N/A 06/08/2015   Procedure: COLONOSCOPY;  Surgeon: Mauri Pole, MD;  Location: [facility] ENDOSCOPY;  Service: Endoscopy;  Laterality: N/A;  BMI > 50   Social History    Occupational History  . Not on file  Tobacco Use  . Smoking status: Never Smoker  . Smokeless tobacco: Never Used  Substance and Sexual Activity  . Alcohol use: Yes    Alcohol/week: 0.0 standard drinks    Comment: occasionally  . Drug use: No  . Sexual activity: Not on file

## 2019-09-05 NOTE — Telephone Encounter (Signed)
Pt insurance is [employer]. They do not cover HA injections

## 2019-09-06 NOTE — Telephone Encounter (Signed)
Joseph Armstrong is fine by me if insurance will cover

## 2019-09-06 NOTE — Telephone Encounter (Signed)
Weight loss and then TKA

## 2019-09-06 NOTE — Telephone Encounter (Signed)
We discussed yesterday with him and wife that def tx is B TKR.  We gave him a target weight to get to.  His wife wrote this down

## 2019-09-06 NOTE — Telephone Encounter (Signed)
See below

## 2019-09-06 NOTE — Telephone Encounter (Signed)
If he does not want to pay out of pocket, what would be the next step?

## 2019-09-06 NOTE — Telephone Encounter (Signed)
Called to advise patient. He would like to know how much exactly he would need to pay out of pocket? Would like a CB to discuss  [phone]

## 2019-09-06 NOTE — Telephone Encounter (Signed)
[place], thanks. I guess we can let patient know how much it would be out of pocket and see if he would like to proceed

## 2019-09-06 NOTE — Telephone Encounter (Signed)
Patients OOP will be 2,250 for 3 series and  4200 for Cheapest 1 series for both knees. I spoke with Abigail Butts. She wanted me to ask if Dr. Erlinda Hong would consider trying the Zilretta injection instead?

## 2019-09-09 NOTE — Telephone Encounter (Signed)
2 months

## 2019-09-09 NOTE — Telephone Encounter (Signed)
I called [employer], s/w Apolonio Schneiders, and for CPT [phone] Zilretta, it is a covered benefit for this plan.  There is no medical deductible on this plan.  Office visit MUST be billed, and patient will have a $100 copay.  Ref # to call is [id number]. I will need to order this medication.  Please call patient to advise.  Let me know if patient wants to proceed?

## 2019-09-09 NOTE — Telephone Encounter (Signed)
Erlinda Hong said if insurance will cover he's ok with this

## 2019-09-09 NOTE — Telephone Encounter (Signed)
How long after cortisone injection should patient wait to have Zilretta injection?

## 2019-09-09 NOTE — Telephone Encounter (Signed)
Joseph Armstrong, can you help me with this?

## 2019-09-09 NOTE — Telephone Encounter (Signed)
I spoke with the pt. He wanted to know when he can get this injection since he just had a cortisone shot

## 2019-09-09 NOTE — Telephone Encounter (Signed)
Lvm for pt to call me back to discuss

## 2019-09-09 NOTE — Telephone Encounter (Signed)
Patient will need to wait until 2 months after the cortisone injection to have the Zilretta.  Will you call him to advise?  You can go ahead and schedule him if you want, just remind me 2 weeks approximately prior to appt so I can order meds.

## 2019-09-10 NOTE — Telephone Encounter (Signed)
Left a 2nd voicemail.

## 2019-09-11 NOTE — Telephone Encounter (Signed)
Left 3rd voicemail

## 2019-09-12 ENCOUNTER — Ambulatory Visit: Payer: 59 | Admitting: Family Medicine

## 2019-09-13 DIAGNOSIS — M79605 Pain in left leg: Secondary | ICD-10-CM | POA: Insufficient documentation

## 2019-09-13 DIAGNOSIS — M79604 Pain in right leg: Secondary | ICD-10-CM | POA: Insufficient documentation

## 2019-09-13 NOTE — Assessment & Plan Note (Signed)
Most likely related to OA of knees, but pt endorse leg pain all over as well.. difficult historian.  Will eval lumbar X-ray to make sure no sign of spinal stenosis.  no S/S of PAD/claudication.  Morbid obesity likey primary cause of leg pain. Encouraged exercise, weight loss, healthy eating habits.   Increase to BID diclofenac. If not improving consider referral back to Ortho or further eval of back with MRI.

## 2019-09-17 ENCOUNTER — Encounter: Payer: Self-pay | Admitting: Family Medicine

## 2019-09-17 ENCOUNTER — Ambulatory Visit (INDEPENDENT_AMBULATORY_CARE_PROVIDER_SITE_OTHER): Payer: 59 | Admitting: Family Medicine

## 2019-09-17 ENCOUNTER — Other Ambulatory Visit: Payer: Self-pay

## 2019-09-17 VITALS — BP 118/68 | HR 80 | Temp 98.0°F | Ht 71.0 in | Wt 372.2 lb

## 2019-09-17 DIAGNOSIS — E7800 Pure hypercholesterolemia, unspecified: Secondary | ICD-10-CM

## 2019-09-17 DIAGNOSIS — E119 Type 2 diabetes mellitus without complications: Secondary | ICD-10-CM

## 2019-09-17 DIAGNOSIS — Z125 Encounter for screening for malignant neoplasm of prostate: Secondary | ICD-10-CM | POA: Diagnosis not present

## 2019-09-17 DIAGNOSIS — M79605 Pain in left leg: Secondary | ICD-10-CM

## 2019-09-17 DIAGNOSIS — Z6841 Body Mass Index (BMI) 40.0 and over, adult: Secondary | ICD-10-CM | POA: Diagnosis not present

## 2019-09-17 DIAGNOSIS — M79604 Pain in right leg: Secondary | ICD-10-CM

## 2019-09-17 DIAGNOSIS — M170 Bilateral primary osteoarthritis of knee: Secondary | ICD-10-CM | POA: Diagnosis not present

## 2019-09-17 DIAGNOSIS — I10 Essential (primary) hypertension: Secondary | ICD-10-CM

## 2019-09-17 DIAGNOSIS — E538 Deficiency of other specified B group vitamins: Secondary | ICD-10-CM

## 2019-09-17 DIAGNOSIS — C61 Malignant neoplasm of prostate: Secondary | ICD-10-CM

## 2019-09-17 DIAGNOSIS — Z1159 Encounter for screening for other viral diseases: Secondary | ICD-10-CM

## 2019-09-17 LAB — COMPREHENSIVE METABOLIC PANEL
ALT: 20 U/L (ref 0–53)
AST: 15 U/L (ref 0–37)
Albumin: 4.2 g/dL (ref 3.5–5.2)
Alkaline Phosphatase: 56 U/L (ref 39–117)
BUN: 18 mg/dL (ref 6–23)
CO2: 31 mEq/L (ref 19–32)
Calcium: 9.7 mg/dL (ref 8.4–10.5)
Chloride: 103 mEq/L (ref 96–112)
Creatinine, Ser: 1.31 mg/dL (ref 0.40–1.50)
GFR: 67.76 mL/min (ref >60.00)
Glucose, Bld: 103 mg/dL — ABNORMAL HIGH (ref 70–99)
Potassium: 4.6 mEq/L (ref 3.5–5.1)
Sodium: 139 mEq/L (ref 135–145)
Total Bilirubin: 0.4 mg/dL (ref 0.2–1.2)
Total Protein: 7.4 g/dL (ref 6.0–8.3)

## 2019-09-17 LAB — VITAMIN B12: Vitamin B-12: 184 pg/mL — ABNORMAL LOW (ref 211–911)

## 2019-09-17 LAB — LIPID PANEL
Cholesterol: 171 mg/dL (ref 0–200)
HDL: 48.1 mg/dL (ref >39.00)
LDL Cholesterol: 114 mg/dL — ABNORMAL HIGH (ref 0–99)
NonHDL: 122.47
Total CHOL/HDL Ratio: 4
Triglycerides: 41 mg/dL (ref 0.0–149.0)
VLDL: 8.2 mg/dL (ref 0.0–40.0)

## 2019-09-17 LAB — PSA: PSA: 0.36 ng/mL (ref 0.10–4.00)

## 2019-09-17 NOTE — Progress Notes (Signed)
Chief Complaint  Patient presents with  . Annual Exam    Patient thinks he is hear to follow up on Ortho Appt.  AVS states to return for CPE    History of Present Illness: HPI  The patient is here for  Follow up knee pain.     Saw Dr. Domingo Madeira for  Bilateral knee pain 09/16/19 Given cortisone injection in left knee.  Pending approval of viscosupplemntation Bilateral knees.  Working on weight loss so he can qualify for  Bilateral TKR. On BID diclofenac... did not help much. He reports that after the steroid injection.. the knee pain got worse.  He is having trouble walking. Cannot stand over 2 hours due to low back pain, knee pain bilateral, bilateral leg pain.  He was doing better on light duty, but there is not light duty job open at work. He works on Designer, television/film set.  Has not yet tried tramadol.  Can try the zilretta 2 months after steroid... in 10/2019   Hypertension:    Good control  On losartan HCTZ BP Readings from Last 3 Encounters:  09/17/19 118/68  08/09/19 (!) 150/92  05/02/19 (!) 160/90  Using medication without problems or lightheadedness:  none Chest pain with exertion:none Edema:none Short of breath:none Average home BPs: Other issues:    morbid obesity  Wt Readings from Last 3 Encounters:  09/17/19 (!) 372 lb 4 oz (168.9 kg)  09/05/19 (!) 360 lb (163.3 kg)  08/09/19 (!) 361 lb 8 oz (164 kg)     prediabetes   Due for lab re-eval CHOl and DM, CMAT   This visit occurred during the SARS-CoV-2 public health emergency.  Safety protocols were in place, including screening questions prior to the visit, additional usage of staff PPE, and extensive cleaning of exam room while observing appropriate contact time as indicated for disinfecting solutions.   COVID 19 screen:  No recent travel or known exposure to COVID19 The patient denies respiratory symptoms of COVID 19 at this time. The importance of social distancing was discussed today.     Review of Systems   Constitutional: Negative for chills and fever.  HENT: Negative for congestion and ear pain.   Eyes: Negative for pain and redness.  Respiratory: Negative for cough and shortness of breath.   Cardiovascular: Negative for chest pain, palpitations and leg swelling.  Gastrointestinal: Negative for abdominal pain, blood in stool, constipation, diarrhea, nausea and vomiting.  Genitourinary: Negative for dysuria.  Musculoskeletal: Negative for falls and myalgias.  Skin: Negative for rash.  Neurological: Negative for dizziness.  Psychiatric/Behavioral: Negative for depression. The patient is not nervous/anxious.       Past Medical History:  Diagnosis Date  . Atrial fibrillation ([place])   . GERD (gastroesophageal reflux disease)   . Hypertension   . Kidney stones   . Sleep apnea     reports that he has never smoked. He has never used smokeless tobacco. He reports current alcohol use. He reports that he does not use drugs.   Current Outpatient Medications:  .  amLODipine (NORVASC) 10 MG tablet, Take 1 tablet by mouth once daily for high blood pressure., Disp: 90 tablet, Rfl: 0 .  atorvastatin (LIPITOR) 10 MG tablet, TAKE 1 TABLET BY MOUTH ONCE DAILY, Disp: 30 tablet, Rfl: 0 .  diclofenac (VOLTAREN) 75 MG EC tablet, Take 1 tablet (75 mg total) by mouth 2 (two) times daily., Disp: 30 tablet, Rfl: 0 .  losartan-hydrochlorothiazide (HYZAAR) 100-25 MG tablet, Take 1 tablet by  mouth daily., Disp: 90 tablet, Rfl: 0 .  metFORMIN (GLUCOPHAGE) 500 MG tablet, Take 1 tablet (500 mg total) by mouth 2 (two) times daily with a meal. For diabetes, Disp: 180 tablet, Rfl: 3 .  metoprolol succinate (TOPROL-XL) 50 MG 24 hr tablet, TAKE 1 TABLET BY MOUTH ONCE DAILY FOR HIGH BLOOD PRESSURE, Disp: 90 tablet, Rfl: 0   Observations/Objective: Blood pressure 118/68, pulse 80, temperature 98 F (36.7 C), temperature source Temporal, height 5\' 11"  (1.803 m), weight (!) 372 lb 4 oz (168.9 kg), SpO2 96 %.  Physical  Exam Constitutional:      Appearance: He is well-developed. He is obese.  HENT:     Head: Normocephalic.     Right Ear: Hearing normal.     Left Ear: Hearing normal.     Nose: Nose normal.  Neck:     Thyroid: No thyroid mass or thyromegaly.     Vascular: No carotid bruit.     Trachea: Trachea normal.  Cardiovascular:     Rate and Rhythm: Normal rate and regular rhythm.     Pulses: Normal pulses.     Heart sounds: Heart sounds not distant. No murmur. No friction rub. No gallop.      Comments: No peripheral edema Pulmonary:     Effort: Pulmonary effort is normal. No respiratory distress.     Breath sounds: Normal breath sounds.  Musculoskeletal:     Right knee: No swelling, deformity or effusion. Decreased range of motion. Tenderness present over the medial joint line and lateral joint line. No MCL, LCL, ACL, PCL or patellar tendon tenderness. No ACL laxity or PCL laxity. Normal meniscus and normal patellar mobility. Normal pulse.     Instability Tests: Negative medial McMurray test.     Left knee: No swelling, deformity or effusion. Decreased range of motion. Tenderness present over the medial joint line and lateral joint line. No MCL, LCL, ACL or PCL tenderness. No ACL laxity or PCL laxity.Normal meniscus and normal patellar mobility. Normal pulse.     Instability Tests: Negative medial McMurray test.     Right ankle: Normal.     Left ankle: Normal.     Right foot: Normal.     Left foot: Normal.  Skin:    General: Skin is warm and dry.     Findings: No rash.  Psychiatric:        Speech: Speech normal.        Behavior: Behavior normal.        Thought Content: Thought content normal.       Assessment and Plan   The patient's preventative maintenance and recommended screening tests for an annual wellness exam were reviewed in full today. Brought up to date unless services declined.  Counselled on the importance of diet, exercise, and its role in overall health and  mortality. The patient's FH and SH was reviewed, including their home life, tobacco status, and drug and alcohol status.   Bilateral primary osteoarthritis of knee  Unable to stand for prolonged period. Work note to be provided.  Tramadol for pain.   Essential hypertension, malignant Well controlled. Continue current medication. Encouraged exercise, weight loss, healthy eating habits.   Type 2 diabetes mellitus without complication, without long-term current use of insulin (HCC) Due for  re-eval  Hypercholesteremia Due for  re-eval  Morbid obesity with BMI of 50.0-59.9, adult  Encouraged pt to aggressively work on weihgt loss as obesity is contributing to knee pain.   Eliezer Lofts, MD

## 2019-09-17 NOTE — Assessment & Plan Note (Addendum)
Unable to stand for prolonged period. Work note to be provided.  Tramadol for pain.

## 2019-09-17 NOTE — Patient Instructions (Addendum)
Can use tramadol for flare up pain.  We will call when note ready for pick up  Please stop at the lab to have labs drawn.

## 2019-09-18 LAB — HEPATITIS C ANTIBODY
Hepatitis C Ab: NONREACTIVE
SIGNAL TO CUT-OFF: 0.02 (ref <1.00)

## 2019-09-19 LAB — HEMOGLOBIN A1C: Hgb A1c MFr Bld: 6.8 % — ABNORMAL HIGH (ref 4.6–6.5)

## 2019-09-19 NOTE — Progress Notes (Signed)
No critical labs need to be addressed urgently. We will discuss labs in detail at upcoming office visit.   

## 2019-09-20 ENCOUNTER — Encounter: Payer: Self-pay | Admitting: Family Medicine

## 2019-09-24 ENCOUNTER — Ambulatory Visit (INDEPENDENT_AMBULATORY_CARE_PROVIDER_SITE_OTHER): Payer: 59 | Admitting: Family Medicine

## 2019-09-24 ENCOUNTER — Other Ambulatory Visit: Payer: Self-pay

## 2019-09-24 VITALS — BP 150/78 | HR 75 | Temp 97.9°F | Resp 16 | Ht 69.5 in | Wt 370.8 lb

## 2019-09-24 DIAGNOSIS — I10 Essential (primary) hypertension: Secondary | ICD-10-CM

## 2019-09-24 DIAGNOSIS — B372 Candidiasis of skin and nail: Secondary | ICD-10-CM

## 2019-09-24 DIAGNOSIS — E1169 Type 2 diabetes mellitus with other specified complication: Secondary | ICD-10-CM | POA: Insufficient documentation

## 2019-09-24 DIAGNOSIS — E538 Deficiency of other specified B group vitamins: Secondary | ICD-10-CM

## 2019-09-24 DIAGNOSIS — E119 Type 2 diabetes mellitus without complications: Secondary | ICD-10-CM | POA: Diagnosis not present

## 2019-09-24 DIAGNOSIS — Z0000 Encounter for general adult medical examination without abnormal findings: Secondary | ICD-10-CM

## 2019-09-24 DIAGNOSIS — Z6841 Body Mass Index (BMI) 40.0 and over, adult: Secondary | ICD-10-CM

## 2019-09-24 DIAGNOSIS — E1159 Type 2 diabetes mellitus with other circulatory complications: Secondary | ICD-10-CM | POA: Insufficient documentation

## 2019-09-24 DIAGNOSIS — M170 Bilateral primary osteoarthritis of knee: Secondary | ICD-10-CM

## 2019-09-24 DIAGNOSIS — E7800 Pure hypercholesterolemia, unspecified: Secondary | ICD-10-CM

## 2019-09-24 MED ORDER — CYANOCOBALAMIN 1000 MCG/ML IJ SOLN
1000.0000 ug | Freq: Once | INTRAMUSCULAR | Status: AC
  Administered 2019-09-24: 1000 ug via INTRAMUSCULAR

## 2019-09-24 MED ORDER — AMLODIPINE BESYLATE 10 MG PO TABS: ORAL_TABLET | ORAL | 3 refills | Status: DC

## 2019-09-24 MED ORDER — ATORVASTATIN CALCIUM 10 MG PO TABS: 10.0000 mg | ORAL_TABLET | Freq: Every day | ORAL | 3 refills | Status: DC

## 2019-09-24 MED ORDER — METOPROLOL SUCCINATE ER 50 MG PO TB24: ORAL_TABLET | ORAL | 3 refills | Status: DC

## 2019-09-24 MED ORDER — NYSTATIN 100000 UNIT/GM EX CREA: 1.0000 "application " | TOPICAL_CREAM | Freq: Two times a day (BID) | CUTANEOUS | 1 refills | Status: DC

## 2019-09-24 MED ORDER — LOSARTAN POTASSIUM-HCTZ 100-25 MG PO TABS: 1.0000 | ORAL_TABLET | Freq: Every day | ORAL | 3 refills | Status: DC

## 2019-09-24 NOTE — Addendum Note (Signed)
Addended by: Kris Mouton on: 09/24/2019 12:05 PM   Modules accepted: Orders

## 2019-09-24 NOTE — Assessment & Plan Note (Signed)
Restart statin. Re-eval in 6 months.

## 2019-09-24 NOTE — Patient Instructions (Addendum)
Start B12 OTC 1000 mg daily.  Get yearly eye exam. Work on low carb low fat heart healthy diet.

## 2019-09-24 NOTE — Assessment & Plan Note (Signed)
Refilled topical cream.

## 2019-09-24 NOTE — Assessment & Plan Note (Signed)
Stable control on metformin.. info on diet and lifestyle changes given. Offered nutritionist, but this was declined.

## 2019-09-24 NOTE — Progress Notes (Signed)
Chief Complaint  Patient presents with  . Annual Exam    History of Present Illness: HPI  The patient is here for annual wellness exam and preventative care.    Hypertension:   Not at goal in office today given he just took his meds on amlodipine, losartan HCTZ BP Readings from Last 3 Encounters:  09/24/19 (!) 150/78  09/17/19 118/68  08/09/19 (!) 150/92  Using medication without problems or lightheadedness:  none Chest pain with exertion: Edema:none Short of breath:none Average home BPs: usually good control. Other issues:  Diabetes tolerable control on metformin  500 mg twice daily. Lab Results  Component Value Date   HGBA1C 6.8 (H) 09/17/2019   Not recently checking blood sugars.  Elevated Cholesterol:  LDL not at goal.. not taking atorvastatin 10 mg daily  But threw away some meds by accident. Lab Results  Component Value Date   CHOL 171 09/17/2019   HDL 48.10 09/17/2019   LDLCALC 114 (H) 09/17/2019   TRIG 41.0 09/17/2019   CHOLHDL 4 09/17/2019  Using medications without problems: Muscle aches: none Diet compliance: poor Exercise: he has been trying leg strengthening Other complaints:   B12 deficiency.. not on supplement.    This visit occurred during the SARS-CoV-2 public health emergency.  Safety protocols were in place, including screening questions prior to the visit, additional usage of staff PPE, and extensive cleaning of exam room while observing appropriate contact time as indicated for disinfecting solutions.   COVID 19 screen:  No recent travel or known exposure to COVID19 The patient denies respiratory symptoms of COVID 19 at this time. The importance of social distancing was discussed today.     ROS    Past Medical History:  Diagnosis Date  . Atrial fibrillation ([place])   . GERD (gastroesophageal reflux disease)   . Hypertension   . Kidney stones   . Sleep apnea     reports that he has never smoked. He has never used smokeless tobacco.  He reports current alcohol use. He reports that he does not use drugs.   Current Outpatient Medications:  .  amLODipine (NORVASC) 10 MG tablet, Take 1 tablet by mouth once daily for high blood pressure., Disp: 90 tablet, Rfl: 0 .  atorvastatin (LIPITOR) 10 MG tablet, TAKE 1 TABLET BY MOUTH ONCE DAILY, Disp: 30 tablet, Rfl: 0 .  diclofenac (VOLTAREN) 75 MG EC tablet, Take 1 tablet (75 mg total) by mouth 2 (two) times daily., Disp: 30 tablet, Rfl: 0 .  losartan-hydrochlorothiazide (HYZAAR) 100-25 MG tablet, Take 1 tablet by mouth daily., Disp: 90 tablet, Rfl: 0 .  metFORMIN (GLUCOPHAGE) 500 MG tablet, Take 1 tablet (500 mg total) by mouth 2 (two) times daily with a meal. For diabetes, Disp: 180 tablet, Rfl: 3 .  metoprolol succinate (TOPROL-XL) 50 MG 24 hr tablet, TAKE 1 TABLET BY MOUTH ONCE DAILY FOR HIGH BLOOD PRESSURE, Disp: 90 tablet, Rfl: 0   Observations/Objective: Blood pressure (!) 150/78, pulse 75, temperature 97.9 F (36.6 C), resp. rate 16, height 5' 9.5" (1.765 m), weight (!) 370 lb 12 oz (168.2 kg), SpO2 98 %.  Physical Exam Constitutional:      General: He is not in acute distress.    Appearance: Normal appearance. He is well-developed. He is obese. He is not ill-appearing or toxic-appearing.  HENT:     Head: Normocephalic and atraumatic.     Right Ear: Hearing, tympanic membrane, ear canal and external ear normal.     Left Ear: Hearing, tympanic  membrane, ear canal and external ear normal.     Nose: Nose normal.     Mouth/Throat:     Pharynx: Uvula midline.  Eyes:     General: Lids are normal. Lids are everted, no foreign bodies appreciated.     Conjunctiva/sclera: Conjunctivae normal.     Pupils: Pupils are equal, round, and reactive to light.  Neck:     Thyroid: No thyroid mass or thyromegaly.     Vascular: No carotid bruit.     Trachea: Trachea and phonation normal.  Cardiovascular:     Rate and Rhythm: Normal rate and regular rhythm.     Pulses: Normal pulses.      Heart sounds: S1 normal and S2 normal. No murmur. No gallop.   Pulmonary:     Breath sounds: Normal breath sounds. No wheezing, rhonchi or rales.  Abdominal:     General: Bowel sounds are normal.     Palpations: Abdomen is soft.     Tenderness: There is no abdominal tenderness. There is no guarding or rebound.     Hernia: No hernia is present.  Musculoskeletal:     Cervical back: Normal range of motion and neck supple.     Right knee: Swelling present. No deformity. Decreased range of motion. Tenderness present over the medial joint line and lateral joint line.     Left knee: Swelling present. No deformity. Decreased range of motion. Tenderness present over the medial joint line and lateral joint line.     Comments:  No swelling bialterally, nml pulses bialteral legs.  Lymphadenopathy:     Cervical: No cervical adenopathy.  Skin:    General: Skin is warm and dry.     Findings: No rash.  Neurological:     Mental Status: He is alert.     Cranial Nerves: No cranial nerve deficit.     Sensory: No sensory deficit.     Gait: Gait normal.     Deep Tendon Reflexes: Reflexes are normal and symmetric.  Psychiatric:        Speech: Speech normal.        Behavior: Behavior normal.        Judgment: Judgment normal.      Assessment and Plan   The patient's preventative maintenance and recommended screening tests for an annual wellness exam were reviewed in full today. Brought up to date unless services declined.  Counselled on the importance of diet, exercise, and its role in overall health and mortality. The patient's FH and SH was reviewed, including their home life, tobacco status, and drug and alcohol status.    Vaccines: uptodate td  Prostate Cancer Screen:  stable Lab Results  Component Value Date   PSA 0.36 09/17/2019   PSA 0.46 03/02/2015  Colon Cancer Screen: 2016, repeat in 10 years      Smoking Status: none ETOH/ drug use: none/none  Hep C: neg  HIV screen:   refused  Type 2 diabetes mellitus without complication, without long-term current use of insulin (HCC) Stable control on metformin.. info on diet and lifestyle changes given. Offered nutritionist, but this was declined.  Vitamin B12 deficiency Replete with injeciton then follow with oral.  Essential hypertension, malignant Well controlled when has taken all meds. Encouraged compliance.  Candidal intertrigo Refilled topical cream.  Bilateral primary osteoarthritis of knee Now followed by ortho. Very severe.. minimal improvement with steroids and NSAIDs. Plan joint lubricant injections in 2 months.  Working on weight loss with TKR as goal.  Hypercholesteremia Restart statin. Re-eval in 6 months.     Eliezer Lofts, MD

## 2019-09-24 NOTE — Assessment & Plan Note (Signed)
Well controlled when has taken all meds. Encouraged compliance.

## 2019-09-24 NOTE — Assessment & Plan Note (Signed)
Now followed by ortho. Very severe.. minimal improvement with steroids and NSAIDs. Plan joint lubricant injections in 2 months.  Working on weight loss with TKR as goal.

## 2019-09-24 NOTE — Assessment & Plan Note (Signed)
Replete with injeciton then follow with oral.

## 2019-10-02 ENCOUNTER — Other Ambulatory Visit: Payer: Self-pay

## 2019-10-02 ENCOUNTER — Ambulatory Visit (INDEPENDENT_AMBULATORY_CARE_PROVIDER_SITE_OTHER): Payer: 59 | Admitting: Family Medicine

## 2019-10-02 ENCOUNTER — Encounter: Payer: Self-pay | Admitting: Family Medicine

## 2019-10-02 VITALS — BP 144/90 | HR 68 | Temp 97.9°F | Ht 69.5 in | Wt 368.2 lb

## 2019-10-02 DIAGNOSIS — Z6841 Body Mass Index (BMI) 40.0 and over, adult: Secondary | ICD-10-CM

## 2019-10-02 DIAGNOSIS — M170 Bilateral primary osteoarthritis of knee: Secondary | ICD-10-CM

## 2019-10-02 NOTE — Progress Notes (Signed)
Joseph Seibert T. Verlon Carcione, MD Primary Care and [place] at [facility] [place], [address] Phone: [phone]  FAX: [phone]  Joseph Armstrong - 59 y.o. male  MRN [id number]  Date of Birth: February 11, 1961  Visit Date: 10/02/2019  PCP: Jinny Sanders, MD  Referred by: Jinny Sanders, MD  Chief Complaint  Patient presents with  . Knee Pain    Bilateral    This visit occurred during the SARS-CoV-2 public health emergency.  Safety protocols were in place, including screening questions prior to the visit, additional usage of staff PPE, and extensive cleaning of exam room while observing appropriate contact time as indicated for disinfecting solutions.   Subjective:   Joseph Armstrong is a 59 y.o. very pleasant male patient with Body mass index is 53.6 kg/m. who presents with the following:  Saw Juanna Cao 09/05/2019.  Knees are hurting all the time.  Was out of work for a week.  Did some exercises.  Took her out of work for a week.  Went back last night.  Worked for about 2 hours.   Discussed [place] with Dr. Erlinda Hong. Worked with Pearlie Oyster and [place]. Works as a Set designer.  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 10 mins every once and a while.  He recently saw Dr. Erlinda Hong, and he did some intra-articular corticosteroid injections.  He is continues to have some pain.  BMI is 54 with a weight of 368  Lab Results  Component Value Date   HGBA1C 6.8 (H) 09/17/2019     Review of Systems is noted in the HPI, as appropriate   Objective:   BP (!) 144/90   Pulse 68   Temp 97.9 F (36.6 C) (Temporal)   Ht 5' 9.5" (1.765 m)   Wt (!) 368 lb 4 oz (167 kg)   SpO2 96%   BMI 53.60 kg/m   Bilateral knees: Lack of 3 degrees of extension flexion to 120 degrees.  Significant crepitus.  Pain at the medial and lateral joint lines.  All ligaments are intact.  Provocative meniscal testing causes pain no  mechanical symptoms.  Radiology: XR KNEE 3 VIEW RIGHT  Result Date: 09/05/2019 Marked tricompartmental degenerative changes worse medial patellofemoral compartments   Assessment and Plan:     ICD-10-CM   1. Primary osteoarthritis of knees, bilateral  M17.0   2. Morbid obesity with body mass index (BMI) of 50.0 to 59.9 in adult [facility])  E66.01    Z68.43    My independent review: X-rays: AP Weight-bearing, Weightbearing Lateral, Sunrise views, Lutricia Feil view Indication: knee pain Findings:   There is significant tricompartmental osteoarthritis with severe medial compartmental joint space loss.  There is subchondral sclerosis and osteophytosis in multiple compartments.Electronically Signed  By: Owens Loffler, MD On: 10/02/2019  3:20 PM EST   Patient has severe osteoarthritis.  I have agreed that viscosupplementation would be a appropriate next step.  I encouraged to lose weight at all costs, work on diet, and increase exercise patterns that they can do based on their own pain and physical limitations.  I discussed possibly consider bariatric surgery.  I wrote a letter for the patient to his employer, noting that his current job where he is standing all day is difficult on his knees.  Recommend return to similar shift work where he was able to sit down occasionally.  Follow-up: No follow-ups on file.  No  orders of the defined types were placed in this encounter.  There are no discontinued medications. No orders of the defined types were placed in this encounter.   Signed,  Maud Deed. Sheily Lineman, MD   Outpatient Encounter Medications as of 10/02/2019  Medication Sig  . amLODipine (NORVASC) 10 MG tablet Take 1 tablet by mouth once daily for high blood pressure.  Marland Kitchen atorvastatin (LIPITOR) 10 MG tablet Take 1 tablet (10 mg total) by mouth daily.  . diclofenac (VOLTAREN) 75 MG EC tablet Take 1 tablet (75 mg total) by mouth 2 (two) times daily.  Marland Kitchen losartan-hydrochlorothiazide (HYZAAR)  100-25 MG tablet Take 1 tablet by mouth daily.  . metFORMIN (GLUCOPHAGE) 500 MG tablet Take 1 tablet (500 mg total) by mouth 2 (two) times daily with a meal. For diabetes  . metoprolol succinate (TOPROL-XL) 50 MG 24 hr tablet TAKE 1 TABLET BY MOUTH ONCE DAILY FOR HIGH BLOOD PRESSURE  . nystatin cream (MYCOSTATIN) Apply 1 application topically 2 (two) times daily.   No facility-administered encounter medications on file as of 10/02/2019.

## 2019-10-03 ENCOUNTER — Emergency Department (HOSPITAL_COMMUNITY)
Admission: EM | Admit: 2019-10-03 | Discharge: 2019-10-03 | Disposition: A | Discharge status: Home / Self Care | Payer: 59 | Attending: Emergency Medicine | Admitting: Emergency Medicine

## 2019-10-03 ENCOUNTER — Emergency Department (HOSPITAL_COMMUNITY): Payer: 59

## 2019-10-03 ENCOUNTER — Encounter (HOSPITAL_COMMUNITY): Payer: Self-pay | Admitting: Emergency Medicine

## 2019-10-03 DIAGNOSIS — N201 Calculus of ureter: Secondary | ICD-10-CM | POA: Diagnosis not present

## 2019-10-03 DIAGNOSIS — I10 Essential (primary) hypertension: Secondary | ICD-10-CM | POA: Insufficient documentation

## 2019-10-03 DIAGNOSIS — E119 Type 2 diabetes mellitus without complications: Secondary | ICD-10-CM | POA: Insufficient documentation

## 2019-10-03 DIAGNOSIS — Z79899 Other long term (current) drug therapy: Secondary | ICD-10-CM | POA: Diagnosis not present

## 2019-10-03 DIAGNOSIS — R109 Unspecified abdominal pain: Secondary | ICD-10-CM | POA: Diagnosis present

## 2019-10-03 LAB — URINALYSIS, ROUTINE W REFLEX MICROSCOPIC
Bacteria, UA: NONE SEEN
Bilirubin Urine: NEGATIVE
Glucose, UA: NEGATIVE mg/dL
Ketones, ur: NEGATIVE mg/dL
Leukocytes,Ua: NEGATIVE
Nitrite: NEGATIVE
Protein, ur: NEGATIVE mg/dL
Specific Gravity, Urine: 1.014 (ref 1.005–1.030)
pH: 6 (ref 5.0–8.0)

## 2019-10-03 LAB — CBC
HCT: 42.3 % (ref 39.0–52.0)
Hemoglobin: 12.9 g/dL — ABNORMAL LOW (ref 13.0–17.0)
MCH: 25.4 pg — ABNORMAL LOW (ref 26.0–34.0)
MCHC: 30.5 g/dL (ref 30.0–36.0)
MCV: 83.4 fL (ref 80.0–100.0)
Platelets: 178 10*3/uL (ref 150–400)
RBC: 5.07 MIL/uL (ref 4.22–5.81)
RDW: 17.2 % — ABNORMAL HIGH (ref 11.5–15.5)
WBC: 6 10*3/uL (ref 4.0–10.5)
nRBC: 0 % (ref 0.0–0.2)

## 2019-10-03 LAB — COMPREHENSIVE METABOLIC PANEL
ALT: 20 U/L (ref 0–44)
AST: 30 U/L (ref 15–41)
Albumin: 3.7 g/dL (ref 3.5–5.0)
Alkaline Phosphatase: 44 U/L (ref 38–126)
Anion gap: 7 (ref 5–15)
BUN: 20 mg/dL (ref 6–20)
CO2: 28 mmol/L (ref 22–32)
Calcium: 8.9 mg/dL (ref 8.9–10.3)
Chloride: 106 mmol/L (ref 98–111)
Creatinine, Ser: 1.51 mg/dL — ABNORMAL HIGH (ref 0.61–1.24)
GFR calc Af Amer: 58 mL/min — ABNORMAL LOW (ref >60)
GFR calc non Af Amer: 50 mL/min — ABNORMAL LOW (ref >60)
Glucose, Bld: 102 mg/dL — ABNORMAL HIGH (ref 70–99)
Potassium: 5.1 mmol/L (ref 3.5–5.1)
Sodium: 141 mmol/L (ref 135–145)
Total Bilirubin: 1.1 mg/dL (ref 0.3–1.2)
Total Protein: 7.4 g/dL (ref 6.5–8.1)

## 2019-10-03 LAB — LIPASE, BLOOD: Lipase: 49 U/L (ref 11–51)

## 2019-10-03 MED ORDER — ONDANSETRON HCL 4 MG/2ML IJ SOLN
4.0000 mg | Freq: Once | INTRAMUSCULAR | Status: AC
  Administered 2019-10-03: 4 mg via INTRAVENOUS
  Filled 2019-10-03: qty 2

## 2019-10-03 MED ORDER — HYDROMORPHONE HCL 1 MG/ML IJ SOLN
1.0000 mg | Freq: Once | INTRAMUSCULAR | Status: AC
  Administered 2019-10-03: 1 mg via INTRAVENOUS
  Filled 2019-10-03: qty 1

## 2019-10-03 NOTE — ED Provider Notes (Signed)
[facility] DEPT Provider Note   CSN: [id number] Arrival date & time: 10/03/19  1605     History Chief Complaint  Patient presents with  . Abdominal Pain    Joseph Armstrong is a 59 y.o. male.  Patient with a history of kidney stones in the past.  But not recently.  2:00 this afternoon acute onset of left flank pain.  Radiating into the left testicle area.  Associated with nausea but no vomiting.  About an hour ago the pain intensified significantly.  But now doing better.  Past medical history significant for atrial fibrillation hypertension sleep apnea reflux disease.  And history of type 2 diabetes.  And bilateral knee pain.        Past Medical History:  Diagnosis Date  . Atrial fibrillation ([place])   . GERD (gastroesophageal reflux disease)   . Hypertension   . Kidney stones   . Sleep apnea     Patient Active Problem List   Diagnosis Date Noted  . Type 2 diabetes mellitus without complication, without long-term current use of insulin ([place]) 09/24/2019  . Hypercholesteremia 09/24/2019  . Bilateral primary osteoarthritis of knee 09/17/2019  . Acute pain of left knee 04/09/2019  . Candidal intertrigo 06/11/2015  . Special screening for malignant neoplasms, colon   . Preventative health care 03/10/2015  . Morbid obesity with BMI of 50.0-59.9, adult ([place]) 03/10/2015  . Essential hypertension, malignant 02/05/2015  . Vitamin B12 deficiency 02/05/2015  . OSA (obstructive sleep apnea) 02/05/2015    Past Surgical History:  Procedure Laterality Date  . APPENDECTOMY    . COLONOSCOPY N/A 06/08/2015   Procedure: COLONOSCOPY;  Surgeon: Mauri Pole, MD;  Location: [facility];  Service: Endoscopy;  Laterality: N/A;  BMI > 50       Family History  Problem Relation Age of Onset  . Stroke Father   . Hypertension Father   . Hypertension Mother   . Diabetes Maternal Grandfather   . Colon cancer Neg Hx     Social History   Tobacco Use    . Smoking status: Never Smoker  . Smokeless tobacco: Never Used  Substance Use Topics  . Alcohol use: Yes    Alcohol/week: 0.0 standard drinks    Comment: occasionally  . Drug use: No    Home Medications Prior to Admission medications   Medication Sig Start Date End Date Taking? Authorizing Provider  amLODipine (NORVASC) 10 MG tablet Take 1 tablet by mouth once daily for high blood pressure. Patient taking differently: Take 10 mg by mouth daily.  09/24/19  Yes Bedsole, Amy E, MD  atorvastatin (LIPITOR) 10 MG tablet Take 1 tablet (10 mg total) by mouth daily. 09/24/19  Yes Bedsole, Amy E, MD  diclofenac (VOLTAREN) 75 MG EC tablet Take 1 tablet (75 mg total) by mouth 2 (two) times daily. 06/11/19  Yes Bedsole, Amy E, MD  losartan-hydrochlorothiazide (HYZAAR) 100-25 MG tablet Take 1 tablet by mouth daily. 09/24/19  Yes Bedsole, Amy E, MD  metoprolol succinate (TOPROL-XL) 50 MG 24 hr tablet TAKE 1 TABLET BY MOUTH ONCE DAILY FOR HIGH BLOOD PRESSURE Patient taking differently: Take 50 mg by mouth daily.  09/24/19  Yes Bedsole, Amy E, MD  metFORMIN (GLUCOPHAGE) 500 MG tablet Take 1 tablet (500 mg total) by mouth 2 (two) times daily with a meal. For diabetes Patient not taking: Reported on 10/03/2019 10/05/17   Pleas Koch, NP  nystatin cream (MYCOSTATIN) Apply 1 application topically 2 (two) times daily.  09/24/19   Jinny Sanders, MD    Allergies    Patient has no known allergies.  Review of Systems   Review of Systems  Constitutional: Negative for chills and fever.  HENT: Negative for congestion, rhinorrhea and sore throat.   Eyes: Negative for visual disturbance.  Respiratory: Negative for cough and shortness of breath.   Cardiovascular: Negative for chest pain and leg swelling.  Gastrointestinal: Positive for nausea. Negative for abdominal pain, diarrhea and vomiting.  Genitourinary: Positive for flank pain. Negative for dysuria.  Musculoskeletal: Negative for back pain and neck pain.   Skin: Negative for rash.  Neurological: Negative for dizziness, light-headedness and headaches.  Hematological: Does not bruise/bleed easily.  Psychiatric/Behavioral: Negative for confusion.    Physical Exam Updated Vital Signs BP (!) 152/86   Pulse 61   Temp 98.6 F (37 C) (Oral)   Resp 20   SpO2 99%   Physical Exam Vitals and nursing note reviewed.  Constitutional:      Appearance: Normal appearance. He is well-developed.  HENT:     Head: Normocephalic and atraumatic.  Eyes:     Extraocular Movements: Extraocular movements intact.     Conjunctiva/sclera: Conjunctivae normal.     Pupils: Pupils are equal, round, and reactive to light.  Cardiovascular:     Rate and Rhythm: Normal rate and regular rhythm.     Heart sounds: No murmur.  Pulmonary:     Effort: Pulmonary effort is normal. No respiratory distress.     Breath sounds: Normal breath sounds.  Abdominal:     Palpations: Abdomen is soft.     Tenderness: There is no abdominal tenderness.  Musculoskeletal:        General: Normal range of motion.     Cervical back: Normal range of motion and neck supple.  Skin:    General: Skin is warm and dry.  Neurological:     General: No focal deficit present.     Mental Status: He is alert and oriented to person, place, and time.     Cranial Nerves: No cranial nerve deficit.     Sensory: No sensory deficit.     Motor: Weakness present.     ED Results / Procedures / Treatments   Labs (all labs ordered are listed, but only abnormal results are displayed) Labs Reviewed  COMPREHENSIVE METABOLIC PANEL - Abnormal; Notable for the following components:      Result Value   Glucose, Bld 102 (*)    Creatinine, Ser 1.51 (*)    GFR calc non Af Amer 50 (*)    GFR calc Af Amer 58 (*)    All other components within normal limits  CBC - Abnormal; Notable for the following components:   Hemoglobin 12.9 (*)    MCH 25.4 (*)    RDW 17.2 (*)    All other components within normal  limits  URINALYSIS, ROUTINE W REFLEX MICROSCOPIC - Abnormal; Notable for the following components:   Hgb urine dipstick MODERATE (*)    All other components within normal limits  LIPASE, BLOOD    EKG None  Radiology CT Renal Stone Study  Result Date: 10/03/2019 CLINICAL DATA:  59 year old male with flank and left lower quadrant pain. EXAM: CT ABDOMEN AND PELVIS WITHOUT CONTRAST TECHNIQUE: Multidetector CT imaging of the abdomen and pelvis was performed following the standard protocol without IV contrast. COMPARISON:  CT Abdomen and Pelvis 12/19/2005. FINDINGS: Lower chest: Mild to moderate cardiomegaly. No pericardial effusion. Negative lung bases. Hepatobiliary:  Negative noncontrast liver and gallbladder. Pancreas: Negative. Spleen: Negative. Adrenals/Urinary Tract: Normal adrenal glands. Negative noncontrast right kidney and right ureter. No right side urinary calculus. Mild left hydronephrosis and hydroureter with asymmetric perinephric and periureteral stranding. The left ureter remains mildly dilated to the UVJ and a punctate 2 mm calculus is located just inside the bladder (series 2, image 75). No other urinary calculus identified. Bladder otherwise unremarkable. Stomach/Bowel: Negative descending and rectosigmoid colon aside from sigmoid redundancy. No convincing diverticula. Negative transverse and right colon, diminutive appendix suspected on coronal image 84. Terminal ileum within normal limits. No dilated small bowel. Negative stomach and duodenum. No free air or free fluid. Vascular/Lymphatic: Vascular patency is not evaluated in the absence of IV contrast. Mild calcified iliac artery atherosclerosis. No lymphadenopathy. Reproductive: Negative. Other: No pelvic free fluid. Musculoskeletal: Lower lumbar spine degeneration. No acute osseous abnormality identified. IMPRESSION: 1. Acute obstructive uropathy on the left with a 2 mm calculus located just inside the bladder beyond the  ureterovesical junction. 2. No other urinary calculus identified. 3. Cardiomegaly. Electronically Signed   By: Genevie Ann M.D.   On: 10/03/2019 19:20    Procedures Procedures (including critical care time)  Medications Ordered in ED Medications  ondansetron (ZOFRAN) injection 4 mg (4 mg Intravenous Given 10/03/19 1933)  HYDROmorphone (DILAUDID) injection 1 mg (1 mg Intravenous Given 10/03/19 1933)    ED Course  I have reviewed the triage vital signs and the nursing notes.  Pertinent labs & imaging results that were available during my care of the patient were reviewed by me and considered in my medical decision making (see chart for details).    MDM Rules/Calculators/A&P                      Labs without significant abnormalities.  Little bit of renal insufficiency.  CT scan shows a 2 mm stone that has passed into the bladder from the left ureter.  Patient now more comfortable.  Urinalysis negative for any signs of urinary tract infection.  Patient has not been followed by urology in the past.  So we will provide a referral.  Patient informed that he should pass the stone the rest of the way without any difficulties.   Final Clinical Impression(s) / ED Diagnoses Final diagnoses:  Left ureteral stone    Rx / DC Orders ED Discharge Orders    None       Fredia Sorrow, MD 10/03/19 2028

## 2019-10-03 NOTE — Discharge Instructions (Signed)
CT scan shows that she passed a 2 mm stone from the left side.  Is now in the bladder.  Should come the rest of the way without any difficulty.  Work note provided.  Make an appointment to follow-up with urology since she will most likely have stones in the future.

## 2019-10-03 NOTE — ED Triage Notes (Signed)
Per EMS-LLQ pain radiating to left flank-history of kidney stones--no dysuria although he stated he "feels funny after he urinates"

## 2019-10-12 NOTE — Assessment & Plan Note (Signed)
Due for re-eval. 

## 2019-10-12 NOTE — Assessment & Plan Note (Signed)
Encouraged pt to aggressively work on weihgt loss as obesity is contributing to knee pain.

## 2019-10-12 NOTE — Assessment & Plan Note (Signed)
Well controlled. Continue current medication. Encouraged exercise, weight loss, healthy eating habits.  

## 2019-12-13 ENCOUNTER — Telehealth (INDEPENDENT_AMBULATORY_CARE_PROVIDER_SITE_OTHER): Payer: 59 | Admitting: Family Medicine

## 2019-12-13 ENCOUNTER — Telehealth: Payer: 59 | Admitting: Family Medicine

## 2019-12-13 ENCOUNTER — Other Ambulatory Visit: Payer: Self-pay

## 2019-12-13 ENCOUNTER — Encounter: Payer: Self-pay | Admitting: Family Medicine

## 2019-12-13 VITALS — Temp 98.6°F | Ht 69.5 in

## 2019-12-13 DIAGNOSIS — R05 Cough: Secondary | ICD-10-CM | POA: Diagnosis not present

## 2019-12-13 DIAGNOSIS — R059 Cough, unspecified: Secondary | ICD-10-CM | POA: Insufficient documentation

## 2019-12-13 MED ORDER — DICLOFENAC SODIUM 75 MG PO TBEC: 75.0000 mg | DELAYED_RELEASE_TABLET | Freq: Two times a day (BID) | ORAL | 0 refills | Status: DC

## 2019-12-13 NOTE — Assessment & Plan Note (Signed)
Likely viral infection, no indication of bacterial infection. Less likely COVID given resolved COVID infeciton just over 3 months ago, still possible and I recommended testing and isolation. Rest, fluids, symptomatic care. Mucinex, tyelnol and can use zyrtec ( full strength)

## 2019-12-13 NOTE — Progress Notes (Signed)
VIRTUAL VISIT Due to national recommendations of social distancing due to [place] 19, a virtual visit is felt to be most appropriate for this patient at this time.   I connected with the patient on 12/13/19 at 10:40 AM EDT by virtual telehealth platform and verified that I am speaking with the correct person using two identifiers.    Interactive audio and video telecommunications were attempted between this provider and patient, however failed, due to patient having technical difficulties OR patient did not have access to video capability.  We continued and completed visit with audio only.   I discussed the limitations, risks, security and privacy concerns of performing an evaluation and management service by  virtual telehealth platform and the availability of in person appointments. I also discussed with the patient that there may be a patient responsible charge related to this service. The patient expressed understanding and agreed to proceed.  Patient location: Home Provider Location: [facility] [place] Participants: Joseph Armstrong and Roxanna Mew   Chief Complaint  Patient presents with  . Nasal Congestion  . Hoarse  . Watery Eyes  . Scratchy Throat    History of Present Illness:  59 year old obese male presents with new onset  nasal congestion, hoarse voice and scratchy throat.  He has also had watery eyes. No ear pain, no face pain, no sinus pressure. He first noted these symptoms in last 2 days. Productive cough, no SOB, no wheeze. No fever.   He has been taking OTC  Mucinex, children zyrtec.   No sick contacts.   Has not been vaccinated for COVID.  COVID 19 screen No recent travel or known exposure to [place]  The importance of social distancing was discussed today.   Review of Systems  Constitutional: Negative for chills and fever.  HENT: Positive for congestion and sore throat. Negative for ear pain.   Eyes: Negative for pain and redness.  Respiratory: Positive for  cough and sputum production. Negative for shortness of breath.   Cardiovascular: Negative for chest pain, palpitations and leg swelling.  Gastrointestinal: Negative for abdominal pain, blood in stool, constipation, diarrhea, nausea and vomiting.  Genitourinary: Negative for dysuria.  Musculoskeletal: Negative for falls and myalgias.  Skin: Negative for rash.  Neurological: Negative for dizziness.  Psychiatric/Behavioral: Negative for depression. The patient is not nervous/anxious.       Past Medical History:  Diagnosis Date  . Atrial fibrillation ([place])   . GERD (gastroesophageal reflux disease)   . Hypertension   . Kidney stones   . Sleep apnea     reports that he has never smoked. He has never used smokeless tobacco. He reports current alcohol use. He reports that he does not use drugs.   Current Outpatient Medications:  .  amLODipine (NORVASC) 10 MG tablet, Take 1 tablet by mouth once daily for high blood pressure., Disp: 90 tablet, Rfl: 3 .  atorvastatin (LIPITOR) 10 MG tablet, Take 1 tablet (10 mg total) by mouth daily., Disp: 90 tablet, Rfl: 3 .  diclofenac (VOLTAREN) 75 MG EC tablet, Take 1 tablet (75 mg total) by mouth 2 (two) times daily., Disp: 30 tablet, Rfl: 0 .  losartan-hydrochlorothiazide (HYZAAR) 100-25 MG tablet, Take 1 tablet by mouth daily., Disp: 90 tablet, Rfl: 3 .  metoprolol succinate (TOPROL-XL) 50 MG 24 hr tablet, TAKE 1 TABLET BY MOUTH ONCE DAILY FOR HIGH BLOOD PRESSURE, Disp: 90 tablet, Rfl: 3 .  nystatin cream (MYCOSTATIN), Apply 1 application topically 2 (two) times daily., Disp: 60  g, Rfl: 1 .  metFORMIN (GLUCOPHAGE) 500 MG tablet, Take 1 tablet (500 mg total) by mouth 2 (two) times daily with a meal. For diabetes (Patient not taking: Reported on 10/03/2019), Disp: 180 tablet, Rfl: 3   Observations/Objective: Temperature 98.6 F (37 C), temperature source Oral, height 5' 9.5" (1.765 m).  Physical Exam  Physical Exam Constitutional:      General: The  patient is not in acute distress. Pulmonary:     Effort: Pulmonary effort is normal. No respiratory distress.  Neurological:     Mental Status: The patient is alert and oriented to person, place, and time.  Psychiatric:        Mood and Affect: Mood normal.        Behavior: Behavior normal.   Assessment and Plan Cough  Likely viral infection, no indication of bacterial infection. Less likely COVID given resolved COVID infeciton just over 3 months ago, still possible and I recommended testing and isolation. Rest, fluids, symptomatic care. Mucinex, tyelnol and can use zyrtec ( full strength)     I discussed the assessment and treatment plan with the patient. The patient was provided an opportunity to ask questions and all were answered. The patient agreed with the plan and demonstrated an understanding of the instructions.   The patient was advised to call back or seek an in-person evaluation if the symptoms worsen or if the condition fails to improve as anticipated.   TIME for telephone visit: 9 minutes.  Joseph Lofts, MD

## 2020-01-22 ENCOUNTER — Telehealth: Payer: Self-pay | Admitting: Family Medicine

## 2020-01-22 NOTE — Telephone Encounter (Signed)
Patient called today stating his throat has been bothering him. He stated that he thought it could be from his CPAP machine, or he might have some type of infection.  Went back to speak with Vallarie Mare since at the time of call we had one appointment open and it was with [facility]. Unfortunately, she stated that if it wasn't from the cpap and he needed to be swab we couldn't do this here. And a virtual would not be the best plan of care since we couldn't look in his throat. Vallarie Mare stated that an UC would be the best plan.   Patient was advised. Stated that he didn't have a sore throat or infection.  Advised that he needed to be seen at the UC so they could look in her throat, patient voiced understanding    This is an [place]

## 2020-03-01 IMAGING — DX DG KNEE 3 VIEWS*L*
3 series · 3 of 3 positions shown · non-contrast
Comparison: 04/09/2019

CLINICAL DATA: Left knee pain

EXAM:
LEFT KNEE - 3 VIEW

[knee ap]
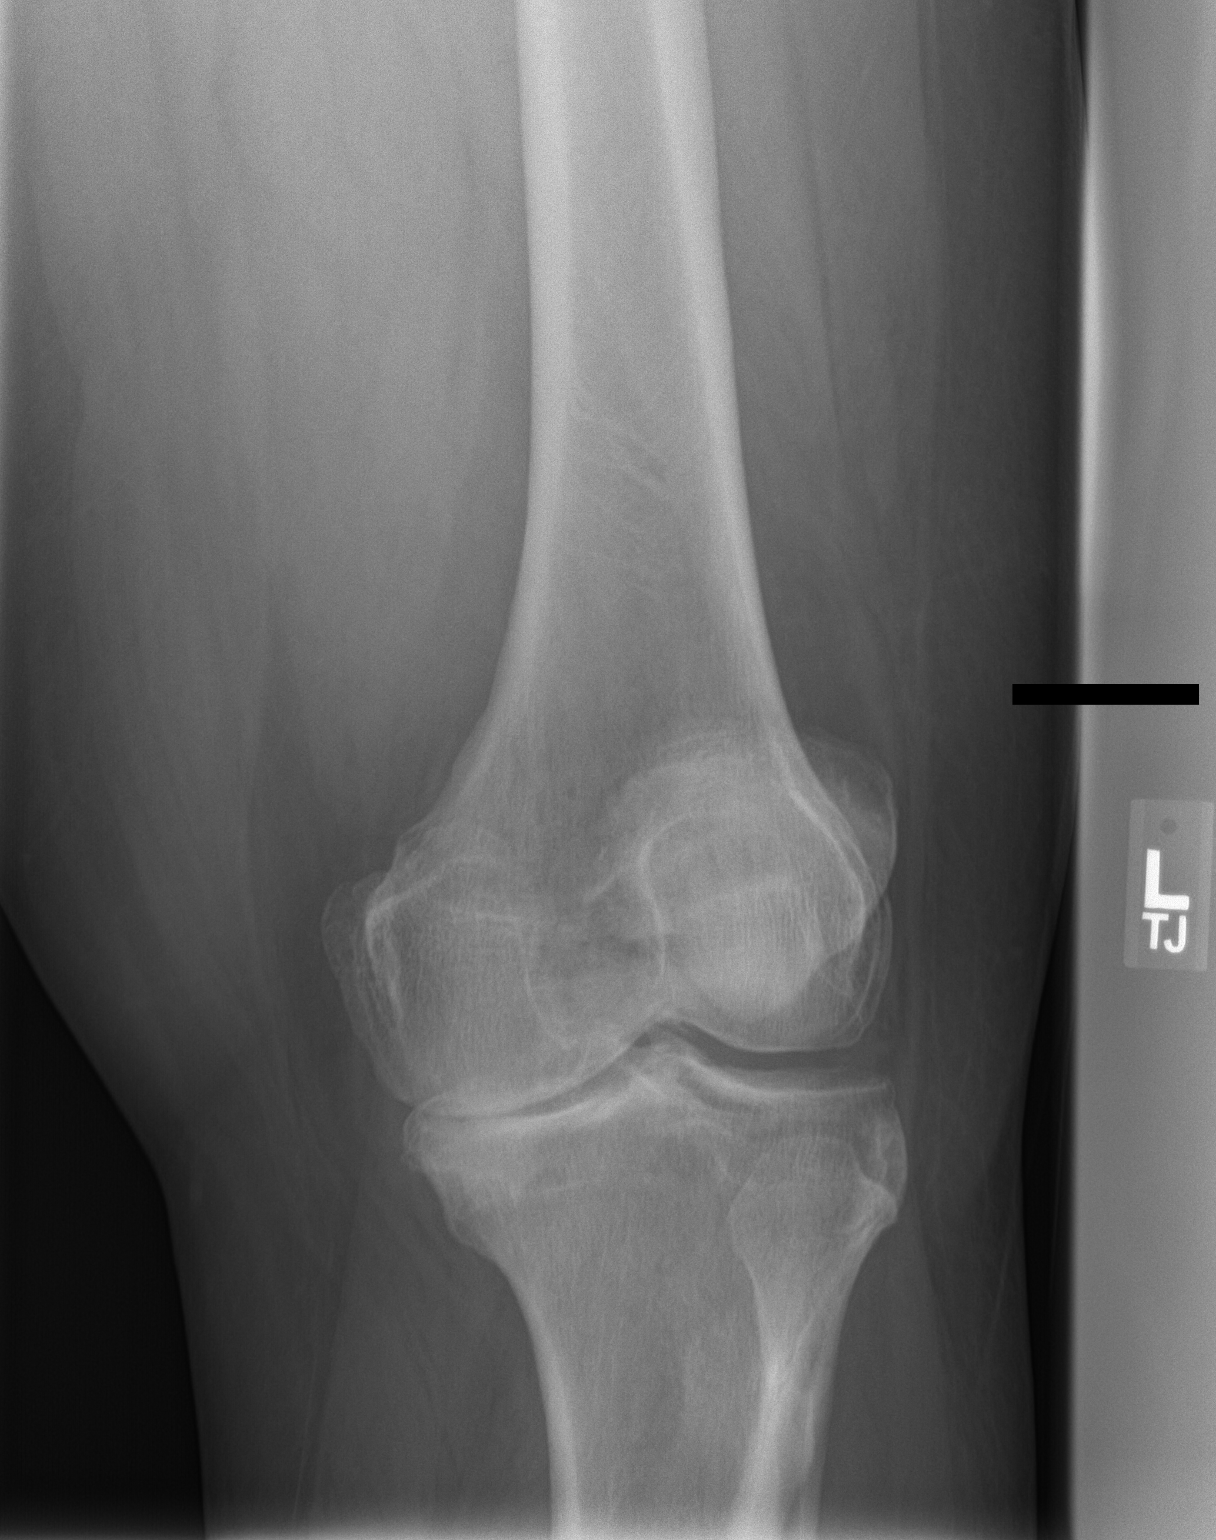

[knee tunnel]
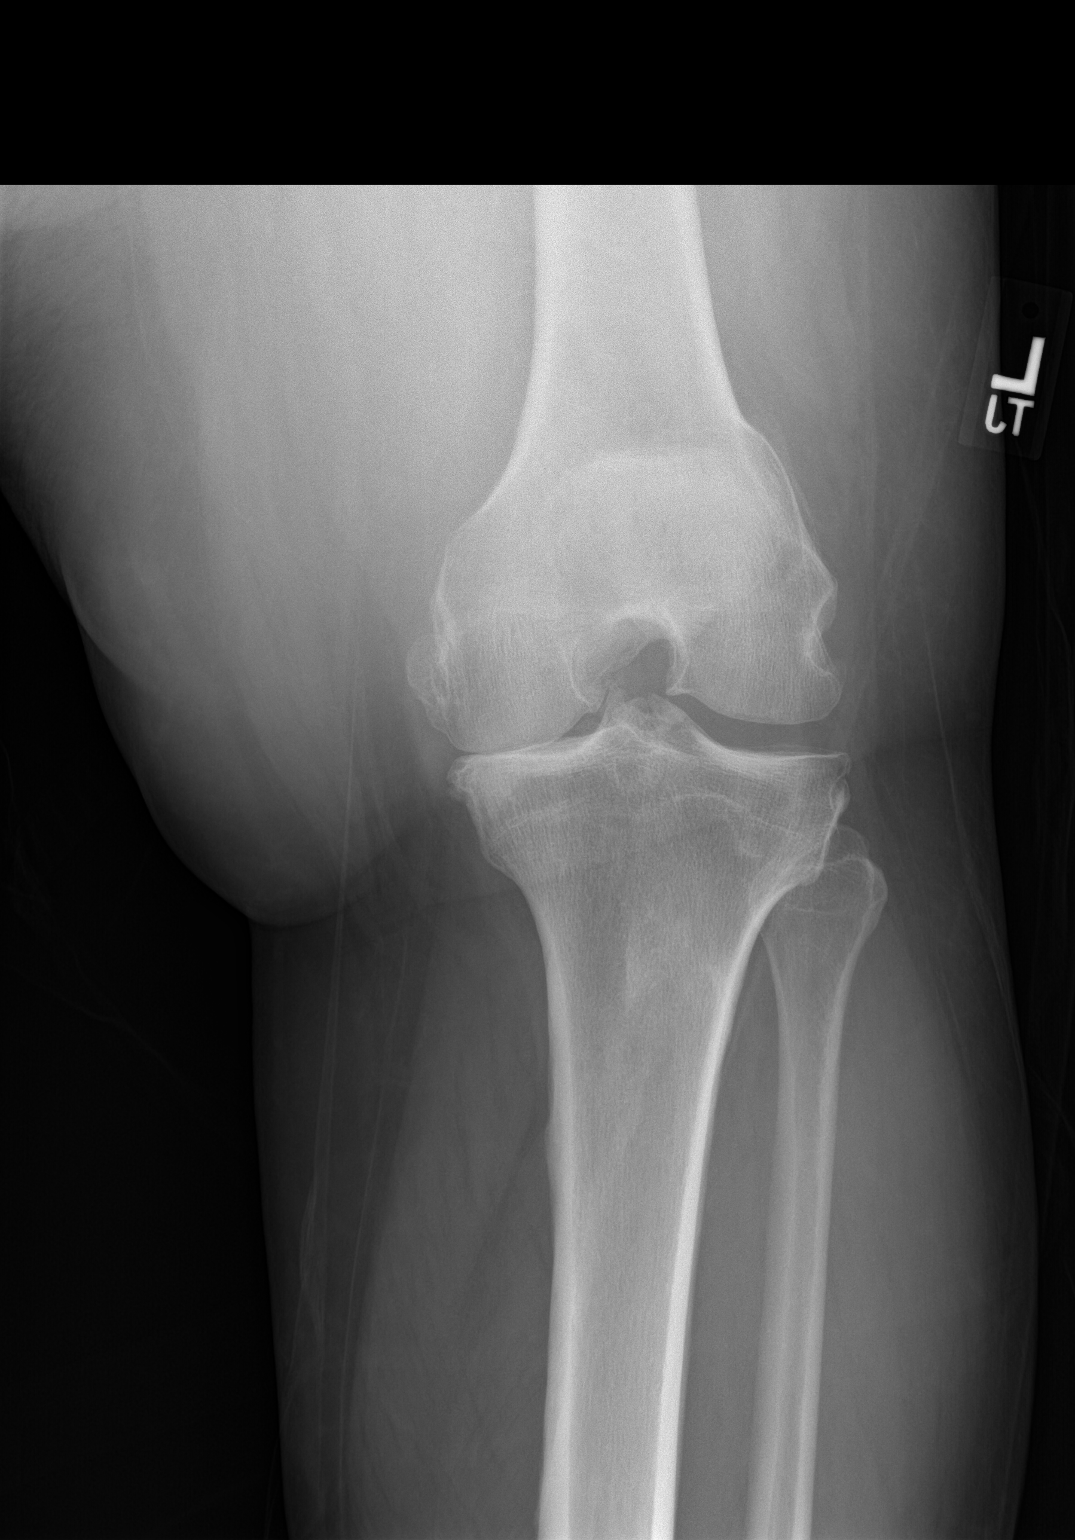

[knee lat]
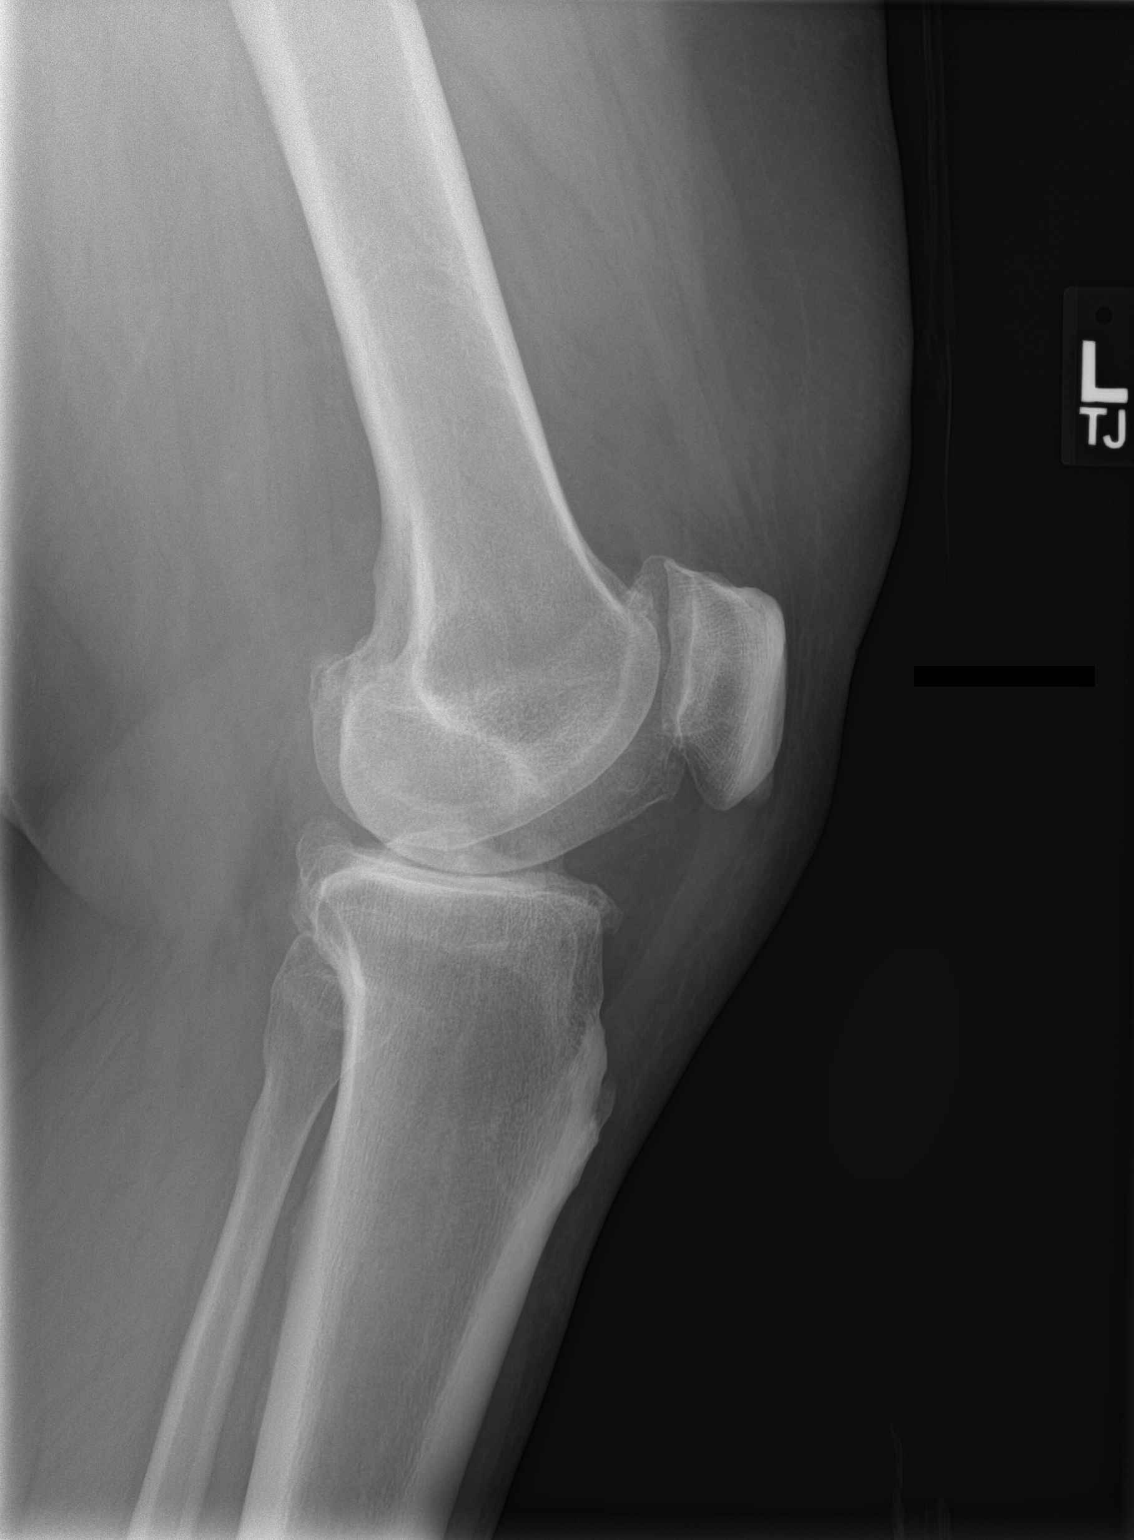

[3 of 3 positions shown; findings below may reference images not displayed]

FINDINGS: Low normal osseous mineralization.

Osteoarthritic changes with joint space narrowing and spur formation
primarily at the medial compartment and patellofemoral joint.

No fracture, subluxation, or bone destruction.

No joint effusion.
IMPRESSION: Osteoarthritic changes LEFT knee.

## 2020-03-27 ENCOUNTER — Other Ambulatory Visit (INDEPENDENT_AMBULATORY_CARE_PROVIDER_SITE_OTHER): Payer: 59

## 2020-03-27 ENCOUNTER — Telehealth: Payer: Self-pay

## 2020-03-27 ENCOUNTER — Telehealth: Payer: Self-pay | Admitting: Family Medicine

## 2020-03-27 ENCOUNTER — Other Ambulatory Visit: Payer: Self-pay

## 2020-03-27 DIAGNOSIS — E119 Type 2 diabetes mellitus without complications: Secondary | ICD-10-CM | POA: Diagnosis not present

## 2020-03-27 DIAGNOSIS — E538 Deficiency of other specified B group vitamins: Secondary | ICD-10-CM

## 2020-03-27 LAB — COMPREHENSIVE METABOLIC PANEL
ALT: 16 U/L (ref 0–53)
AST: 14 U/L (ref 0–37)
Albumin: 4.1 g/dL (ref 3.5–5.2)
Alkaline Phosphatase: 47 U/L (ref 39–117)
BUN: 16 mg/dL (ref 6–23)
CO2: 28 mEq/L (ref 19–32)
Calcium: 8.7 mg/dL (ref 8.4–10.5)
Chloride: 106 mEq/L (ref 96–112)
Creatinine, Ser: 1.36 mg/dL (ref 0.40–1.50)
GFR: 64.78 mL/min (ref >60.00)
Glucose, Bld: 106 mg/dL — ABNORMAL HIGH (ref 70–99)
Potassium: 4 mEq/L (ref 3.5–5.1)
Sodium: 142 mEq/L (ref 135–145)
Total Bilirubin: 0.3 mg/dL (ref 0.2–1.2)
Total Protein: 6.8 g/dL (ref 6.0–8.3)

## 2020-03-27 LAB — LIPID PANEL
Cholesterol: 164 mg/dL (ref 0–200)
HDL: 44 mg/dL (ref >39.00)
LDL Cholesterol: 110 mg/dL — ABNORMAL HIGH (ref 0–99)
NonHDL: 119.8
Total CHOL/HDL Ratio: 4
Triglycerides: 50 mg/dL (ref 0.0–149.0)
VLDL: 10 mg/dL (ref 0.0–40.0)

## 2020-03-27 LAB — HEMOGLOBIN A1C: Hgb A1c MFr Bld: 6.4 % (ref 4.6–6.5)

## 2020-03-27 LAB — VITAMIN B12: Vitamin B-12: 203 pg/mL — ABNORMAL LOW (ref 211–911)

## 2020-03-27 NOTE — Progress Notes (Signed)
No critical labs need to be addressed urgently. We will discuss labs in detail at upcoming office visit.   

## 2020-03-27 NOTE — Telephone Encounter (Signed)
Pt has already had lab appt earlier today and labs collected.

## 2020-03-27 NOTE — Telephone Encounter (Signed)
Pt aware of appointment 9/10

## 2020-03-27 NOTE — Telephone Encounter (Signed)
-----   Message from Cloyd Stagers, RT sent at 03/09/2020 10:32 AM EDT ----- Regarding: Lab Orders for Friday 9.3.2021 Please place lab orders for Friday 9.3.2021, office visit for 6 month f/u on Friday 9.10.2021 Thank you, Dyke Maes RT(R)

## 2020-03-27 NOTE — Telephone Encounter (Signed)
[place] Night - Client Nonclinical Telephone Record AccessNurse Client [facility] Night - Client Client Site [place] - Night Physician Eliezer Lofts - MD Contact Type Call Who Is Calling Patient / Member / Family / Caregiver Caller Name [place] Phone Number [phone] Patient Name Joseph Armstrong Patient DOB January 24, 1961 Call Type Message Only Information Provided Reason for Call Request for General Office Information Initial Comment The caller needs to know if his appointment virtual. Disp. Time Disposition Final User 03/27/2020 7:20:10 AM General Information Provided Yes Carlis Stable Call Closed By: Carlis Stable Transaction Date/Time: 03/27/2020 7:17:32 AM (ET)

## 2020-03-30 LAB — HM DIABETES FOOT EXAM

## 2020-04-03 ENCOUNTER — Ambulatory Visit: Payer: 59 | Admitting: Family Medicine

## 2020-04-07 ENCOUNTER — Ambulatory Visit: Payer: 59 | Admitting: Family Medicine

## 2020-04-10 ENCOUNTER — Ambulatory Visit: Payer: 59 | Admitting: Family Medicine

## 2020-04-14 ENCOUNTER — Ambulatory Visit: Payer: 59 | Admitting: Family Medicine

## 2020-04-17 ENCOUNTER — Ambulatory Visit: Payer: 59 | Admitting: Family Medicine

## 2020-04-23 ENCOUNTER — Encounter: Payer: Self-pay | Admitting: Family Medicine

## 2020-04-23 ENCOUNTER — Other Ambulatory Visit: Payer: Self-pay

## 2020-04-23 ENCOUNTER — Ambulatory Visit (INDEPENDENT_AMBULATORY_CARE_PROVIDER_SITE_OTHER): Payer: 59 | Admitting: Family Medicine

## 2020-04-23 VITALS — BP 132/90 | HR 81 | Temp 98.0°F | Ht 69.5 in | Wt 384.5 lb

## 2020-04-23 DIAGNOSIS — E538 Deficiency of other specified B group vitamins: Secondary | ICD-10-CM

## 2020-04-23 DIAGNOSIS — E119 Type 2 diabetes mellitus without complications: Secondary | ICD-10-CM

## 2020-04-23 DIAGNOSIS — M170 Bilateral primary osteoarthritis of knee: Secondary | ICD-10-CM

## 2020-04-23 DIAGNOSIS — L602 Onychogryphosis: Secondary | ICD-10-CM

## 2020-04-23 DIAGNOSIS — I10 Essential (primary) hypertension: Secondary | ICD-10-CM | POA: Diagnosis not present

## 2020-04-23 DIAGNOSIS — E7800 Pure hypercholesterolemia, unspecified: Secondary | ICD-10-CM

## 2020-04-23 DIAGNOSIS — Z6841 Body Mass Index (BMI) 40.0 and over, adult: Secondary | ICD-10-CM

## 2020-04-23 MED ORDER — CYANOCOBALAMIN 1000 MCG/ML IJ SOLN
1000.0000 ug | Freq: Once | INTRAMUSCULAR | Status: AC
  Administered 2020-04-23: 1000 ug via INTRAMUSCULAR

## 2020-04-23 NOTE — Patient Instructions (Addendum)
Set up yearly eye exam for diabetes and have the opthalmologist send [place] a copy of the evaluation for the chart. Work on low impact exercise.  Return to see Dr. Erlinda Hong for viscosupplementation.  Knee replacement likely the plan when weihg tloss.  Continue daily B12 vitamin 1000 mg   Make sure taking atorvastatin every day.  Work on low carb low  Cholesterol diet.  Call if interested in seeing a nutritionist.  Podiatrist will call to set up appt with you.

## 2020-04-23 NOTE — Progress Notes (Signed)
Chief Complaint  Patient presents with  . Diabetes    History of Present Illness: HPI   59 year old male presents for DM follow up He has been out of work for 6 months.. he continues to have severe knee pain, left > right. Cannot stand for longer than 30 min. Cannot ride in car without severe stiffness and pain.  Has had  See Dr. Erlinda Hong: Ortho at [facility].. recommended weight loss and viscosupplementation.  Dr. Wylene Simmer also recommended Visco-suppemntations.   Currrently looking in disability.    07/27/2019: X-ray : Findings:   There is significant tricompartmental osteoarthritis with severe medial compartmental joint space loss.  There is subchondral sclerosis and osteophytosis in multiple compartments  Diabetes:  No longer taking metformin in 4-5 month... given nausea Lab Results  Component Value Date   HGBA1C 6.4 03/27/2020  Using medications without difficulties: Hypoglycemic episodes: Hyperglycemic episodes: Feet problems: Blood Sugars averaging: eye exam within last year:  Elevated Cholesterol: Almost at goal on atorvastatin 10 mg daily  Lab Results  Component Value Date   CHOL 164 03/27/2020   HDL 44.00 03/27/2020   LDLCALC 110 (H) 03/27/2020   TRIG 50.0 03/27/2020   CHOLHDL 4 03/27/2020  Using medications without problems:none Muscle aches: none Diet compliance: Exercise: Other complaints:  Hypertension:   IMproved control on amlodipine 10, losartan HCTZ and metoprolol  BP Readings from Last 3 Encounters:  04/23/20 132/90  10/03/19 (!) 153/88  10/02/19 (!) 144/90  Using medication without problems or lightheadedness:  Chest pain with exertion: Edema: Short of breath: Average home BPs: Other issues:      This visit occurred during the SARS-CoV-2 public health emergency.  Safety protocols were in place, including screening questions prior to the visit, additional usage of staff PPE, and extensive cleaning of exam room while observing appropriate contact  time as indicated for disinfecting solutions.   COVID 19 screen:  No recent travel or known exposure to COVID19 The patient denies respiratory symptoms of COVID 19 at this time. The importance of social distancing was discussed today.     Review of Systems  Constitutional: Positive for malaise/fatigue. Negative for chills and fever.  HENT: Negative for congestion and ear pain.   Eyes: Negative for pain and redness.  Respiratory: Negative for cough and shortness of breath.   Cardiovascular: Negative for chest pain, palpitations and leg swelling.  Gastrointestinal: Negative for abdominal pain, blood in stool, constipation, diarrhea, nausea and vomiting.  Genitourinary: Negative for dysuria.  Musculoskeletal: Positive for joint pain. Negative for falls and myalgias.  Skin: Negative for rash.  Neurological: Negative for dizziness.  Psychiatric/Behavioral: Negative for depression. The patient is not nervous/anxious.       Past Medical History:  Diagnosis Date  . Atrial fibrillation ([place])   . GERD (gastroesophageal reflux disease)   . Hypertension   . Kidney stones   . Sleep apnea     reports that he has never smoked. He has never used smokeless tobacco. He reports current alcohol use. He reports that he does not use drugs.   Current Outpatient Medications:  .  amLODipine (NORVASC) 10 MG tablet, Take 1 tablet by mouth once daily for high blood pressure., Disp: 90 tablet, Rfl: 3 .  atorvastatin (LIPITOR) 10 MG tablet, Take 1 tablet (10 mg total) by mouth daily., Disp: 90 tablet, Rfl: 3 .  diclofenac (VOLTAREN) 75 MG EC tablet, Take 1 tablet (75 mg total) by mouth 2 (two) times daily., Disp: 30 tablet, Rfl: 0 .  losartan-hydrochlorothiazide (HYZAAR) 100-25 MG tablet, Take 1 tablet by mouth daily., Disp: 90 tablet, Rfl: 3 .  metoprolol succinate (TOPROL-XL) 50 MG 24 hr tablet, TAKE 1 TABLET BY MOUTH ONCE DAILY FOR HIGH BLOOD PRESSURE, Disp: 90 tablet, Rfl: 3 .  nystatin cream  (MYCOSTATIN), Apply 1 application topically 2 (two) times daily., Disp: 60 g, Rfl: 1 .  metFORMIN (GLUCOPHAGE) 500 MG tablet, Take 1 tablet (500 mg total) by mouth 2 (two) times daily with a meal. For diabetes (Patient not taking: Reported on 10/03/2019), Disp: 180 tablet, Rfl: 3   Observations/Objective: Blood pressure 132/90, pulse 81, temperature 98 F (36.7 C), temperature source Temporal, height 5' 9.5" (1.765 m), weight (!) 384 lb 8 oz (174.4 kg), SpO2 96 %.  Physical Exam Constitutional:      Appearance: He is well-developed. He is obese.  HENT:     Head: Normocephalic.     Right Ear: Hearing normal.     Left Ear: Hearing normal.     Nose: Nose normal.  Neck:     Thyroid: No thyroid mass or thyromegaly.     Vascular: No carotid bruit.     Trachea: Trachea normal.  Cardiovascular:     Rate and Rhythm: Normal rate and regular rhythm.     Pulses: Normal pulses.     Heart sounds: Heart sounds not distant. No murmur heard.  No friction rub. No gallop.      Comments: No peripheral edema Pulmonary:     Effort: Pulmonary effort is normal. No respiratory distress.     Breath sounds: Normal breath sounds.  Musculoskeletal:     Right knee: No swelling, deformity or effusion. Decreased range of motion. Tenderness present over the medial joint line and lateral joint line. No MCL, LCL, ACL, PCL or patellar tendon tenderness. No ACL laxity or PCL laxity. Normal meniscus and normal patellar mobility. Normal pulse.     Instability Tests: Medial McMurray test negative.     Left knee: No swelling, deformity or effusion. Decreased range of motion. Tenderness present over the medial joint line and lateral joint line. No MCL, LCL, ACL or PCL tenderness. No ACL laxity or PCL laxity.Normal meniscus and normal patellar mobility. Normal pulse.     Instability Tests: Medial McMurray test negative.     Right ankle: Normal.     Left ankle: Normal.     Right foot: Normal.     Left foot: Normal.  Skin:     General: Skin is warm and dry.     Findings: No rash.  Psychiatric:        Speech: Speech normal.        Behavior: Behavior normal.        Thought Content: Thought content normal.     Diabetic foot exam: Normal inspection No skin breakdown No calluses  Normal DP pulses Normal sensation to light touch and monofilament Nails thickned and incurving Assessment and Plan   Type 2 diabetes mellitus without complication, without long-term current use of insulin (HCC)  Tolerable control off metfomrin ( caused SE) Work on low carb, low cholesterol diet. Encouraged exercise, weight loss, healthy eating habits.  Call if interested in seeing a nutritionist.  Podiatrist will call to set up appt with you.   Morbid obesity with BMI of 50.0-59.9, adult Weight loss is key to  Improvement of chronic knee pain.  Hypercholesteremia Continue atorvastatin and improve compliance.  Essential hypertension, malignant Well controlled. Continue current medication.   Bilateral primary osteoarthritis of  knee  Return to see Dr. Erlinda Hong for viscosupplementation.  Working on obtaining disability.     Eliezer Lofts, MD

## 2020-04-27 ENCOUNTER — Telehealth: Payer: Self-pay | Admitting: Family Medicine

## 2020-04-27 NOTE — Telephone Encounter (Signed)
Referral has been updated

## 2020-04-27 NOTE — Telephone Encounter (Signed)
Patient called in stating he would like to change the referral podiatry to [facility]. Patient stated he notified them of this and it was still placed with [facility] even though he requested . Please advise.

## 2020-05-05 ENCOUNTER — Telehealth: Payer: Self-pay

## 2020-05-05 ENCOUNTER — Ambulatory Visit: Payer: 59 | Admitting: Internal Medicine

## 2020-05-05 NOTE — Telephone Encounter (Signed)
Needs appt to be eval'd if refused appt today with [facility].Marland Kitchen add to my Friday or Thurs.   Agree with flonase, can add coricidin, get COVID testing in meantime.

## 2020-05-05 NOTE — Telephone Encounter (Signed)
I called and offered him 12:15 with [facility] and he declined.

## 2020-05-05 NOTE — Telephone Encounter (Signed)
Left message for Mr. Hufford with below information from Dr. Diona Browner.  I ask that if he would like to schedule a virtual visit with Dr. Diona Browner to call the office back to be put on the schedule for Thursday or Friday.  I also gave him the [facility] phone number to call and set up Covid testing.

## 2020-05-05 NOTE — Telephone Encounter (Signed)
Pt called in wanting to do a virtual visit for a possible sinus infection. Actually wanted an antibiotic called in for nasal congestion and sore throat that started a day or 2 ago. He is going to get Flonase like he used awhile back. Asking if he can be worked in or if there is something else he can do. No other symptoms that he mentioned.

## 2020-05-06 NOTE — Assessment & Plan Note (Signed)
Well controlled. Continue current medication.  

## 2020-05-06 NOTE — Assessment & Plan Note (Signed)
Return to see Dr. Erlinda Hong for viscosupplementation.  Working on obtaining disability.

## 2020-05-06 NOTE — Assessment & Plan Note (Signed)
Tolerable control off metfomrin ( caused SE) Work on low carb, low cholesterol diet. Encouraged exercise, weight loss, healthy eating habits.  Call if interested in seeing a nutritionist.  Podiatrist will call to set up appt with you.

## 2020-05-06 NOTE — Assessment & Plan Note (Signed)
Continue atorvastatin and improve compliance.

## 2020-05-06 NOTE — Assessment & Plan Note (Signed)
Weight loss is key to  Improvement of chronic knee pain.

## 2020-06-17 ENCOUNTER — Ambulatory Visit (INDEPENDENT_AMBULATORY_CARE_PROVIDER_SITE_OTHER): Payer: 59 | Admitting: Family Medicine

## 2020-06-17 ENCOUNTER — Encounter: Payer: Self-pay | Admitting: Family Medicine

## 2020-06-17 ENCOUNTER — Other Ambulatory Visit: Payer: Self-pay

## 2020-06-17 VITALS — BP 170/94 | HR 74 | Temp 97.0°F | Ht 69.5 in | Wt 382.8 lb

## 2020-06-17 DIAGNOSIS — H60331 Swimmer's ear, right ear: Secondary | ICD-10-CM

## 2020-06-17 DIAGNOSIS — H6591 Unspecified nonsuppurative otitis media, right ear: Secondary | ICD-10-CM | POA: Diagnosis not present

## 2020-06-17 MED ORDER — AMOXICILLIN 875 MG PO TABS: 875.0000 mg | ORAL_TABLET | Freq: Two times a day (BID) | ORAL | 0 refills | Status: DC

## 2020-06-17 MED ORDER — AMOXICILLIN 875 MG PO TABS: 875.0000 mg | ORAL_TABLET | Freq: Two times a day (BID) | ORAL | 0 refills | Status: AC

## 2020-06-17 MED ORDER — NEOMYCIN-POLYMYXIN-HC 3.5-10000-1 OT SUSP: 4.0000 [drp] | Freq: Four times a day (QID) | OTIC | 0 refills | Status: DC

## 2020-06-17 NOTE — Progress Notes (Signed)
Tuvia Woodrick T. Hawthorne Day, MD, [place] at [facility] [place], [address]  Phone: [phone]  FAX: [phone]  Adren Dollins - 59 y.o. male  MRN [id number]  Date of Birth: 12/26/1960  Date: 06/17/2020  PCP: Jinny Sanders, MD  Referral: Jinny Sanders, MD  Chief Complaint  Patient presents with  . Ear Pain    Right    This visit occurred during the SARS-CoV-2 public health emergency.  Safety protocols were in place, including screening questions prior to the visit, additional usage of staff PPE, and extensive cleaning of exam room while observing appropriate contact time as indicated for disinfecting solutions.   Subjective:   Moe Graca is a 59 y.o. very pleasant male patient with Body mass index is 55.71 kg/m. who presents with the following:  Full, tight and buzzing in the ear.  Bibration in the ear.   It will itch a whole lot.   Has been congested some.  He has been picking at his ear some, and he has been using a lot of Q-tips.  He now has some itching in the ear as well as some pain in the canal, and globally around this side of his face.  He has not any fever, chills, sweats, neurologic complaints, nausea, vomiting, or diarrhea.  All on the R  Has some allergies  OM and OE on the R  Review of Systems is noted in the HPI, as appropriate  Objective:   BP (!) 170/94   Pulse 74   Temp (!) 97 F (36.1 C) (Temporal)   Ht 5' 9.5" (1.765 m)   Wt (!) 382 lb 12 oz (173.6 kg)   SpO2 96%   BMI 55.71 kg/m   GEN: No acute distress; alert,appropriate. PULM: Breathing comfortably in no respiratory distress PSYCH: Normally interactive.   Right ear canal is modestly swollen with some evidence of excoriation.  The tympanic membrane is bulging and no landmarks are available.  Otherwise the ENT exam is entirely unremarkable No lymphadenopathy.  Laboratory and Imaging  Data:  Assessment and Plan:     ICD-10-CM   1. Mucoid otitis media with effusion, right  H65.91   2. Acute swimmer's ear of right side  H60.331    Otitis media.  He also appears to have a otitis externa on the right as well.  Treat as such.  Supportive care.  Meds ordered this encounter  Medications  . DISCONTD: amoxicillin (AMOXIL) 875 MG tablet    Sig: Take 1 tablet (875 mg total) by mouth 2 (two) times daily for 10 days.    Dispense:  20 tablet    Refill:  0  . DISCONTD: neomycin-polymyxin-hydrocortisone (CORTISPORIN) 3.5-10000-1 OTIC suspension    Sig: Place 4 drops into the right ear 4 (four) times daily for 7 days.    Dispense:  10 mL    Refill:  0  . amoxicillin (AMOXIL) 875 MG tablet    Sig: Take 1 tablet (875 mg total) by mouth 2 (two) times daily for 10 days.    Dispense:  20 tablet    Refill:  0  . neomycin-polymyxin-hydrocortisone (CORTISPORIN) 3.5-10000-1 OTIC suspension    Sig: Place 4 drops into both ears 4 (four) times daily.    Dispense:  10 mL    Refill:  0   Medications Discontinued During This Encounter  Medication Reason  . metFORMIN (GLUCOPHAGE) 500 MG tablet Patient  Preference  . amoxicillin (AMOXIL) 875 MG tablet   . neomycin-polymyxin-hydrocortisone (CORTISPORIN) 3.5-10000-1 OTIC suspension    No orders of the defined types were placed in this encounter.   Follow-up: No follow-ups on file.  Signed,  Maud Deed. Kyran Connaughton, MD   Outpatient Encounter Medications as of 06/17/2020  Medication Sig  . amLODipine (NORVASC) 10 MG tablet Take 1 tablet by mouth once daily for high blood pressure.  Marland Kitchen atorvastatin (LIPITOR) 10 MG tablet Take 1 tablet (10 mg total) by mouth daily.  . diclofenac (VOLTAREN) 75 MG EC tablet Take 1 tablet (75 mg total) by mouth 2 (two) times daily.  Marland Kitchen losartan-hydrochlorothiazide (HYZAAR) 100-25 MG tablet Take 1 tablet by mouth daily.  . metoprolol succinate (TOPROL-XL) 50 MG 24 hr tablet TAKE 1 TABLET BY MOUTH ONCE DAILY FOR  HIGH BLOOD PRESSURE  . nystatin cream (MYCOSTATIN) Apply 1 application topically 2 (two) times daily.  Marland Kitchen amoxicillin (AMOXIL) 875 MG tablet Take 1 tablet (875 mg total) by mouth 2 (two) times daily for 10 days.  Marland Kitchen neomycin-polymyxin-hydrocortisone (CORTISPORIN) 3.5-10000-1 OTIC suspension Place 4 drops into both ears 4 (four) times daily.  . [DISCONTINUED] amoxicillin (AMOXIL) 875 MG tablet Take 1 tablet (875 mg total) by mouth 2 (two) times daily for 10 days.  . [DISCONTINUED] metFORMIN (GLUCOPHAGE) 500 MG tablet Take 1 tablet (500 mg total) by mouth 2 (two) times daily with a meal. For diabetes (Patient not taking: Reported on 10/03/2019)  . [DISCONTINUED] neomycin-polymyxin-hydrocortisone (CORTISPORIN) 3.5-10000-1 OTIC suspension Place 4 drops into the right ear 4 (four) times daily for 7 days.   No facility-administered encounter medications on file as of 06/17/2020.

## 2020-12-25 ENCOUNTER — Other Ambulatory Visit: Payer: Self-pay | Admitting: Family Medicine

## 2020-12-25 DIAGNOSIS — I10 Essential (primary) hypertension: Secondary | ICD-10-CM

## 2020-12-28 ENCOUNTER — Other Ambulatory Visit: Payer: Self-pay | Admitting: Family Medicine

## 2020-12-28 DIAGNOSIS — I10 Essential (primary) hypertension: Secondary | ICD-10-CM

## 2020-12-29 ENCOUNTER — Ambulatory Visit: Payer: 59 | Admitting: Family Medicine

## 2021-07-27 ENCOUNTER — Encounter: Payer: Self-pay | Admitting: Family Medicine

## 2021-07-27 ENCOUNTER — Other Ambulatory Visit: Payer: Self-pay

## 2021-07-27 ENCOUNTER — Ambulatory Visit (INDEPENDENT_AMBULATORY_CARE_PROVIDER_SITE_OTHER): Payer: Self-pay | Admitting: Family Medicine

## 2021-07-27 VITALS — BP 150/98 | HR 82 | Temp 97.9°F | Ht 68.5 in | Wt 386.2 lb

## 2021-07-27 DIAGNOSIS — M170 Bilateral primary osteoarthritis of knee: Secondary | ICD-10-CM

## 2021-07-27 DIAGNOSIS — E785 Hyperlipidemia, unspecified: Secondary | ICD-10-CM

## 2021-07-27 DIAGNOSIS — I10 Essential (primary) hypertension: Secondary | ICD-10-CM

## 2021-07-27 DIAGNOSIS — Z6841 Body Mass Index (BMI) 40.0 and over, adult: Secondary | ICD-10-CM

## 2021-07-27 DIAGNOSIS — R0982 Postnasal drip: Secondary | ICD-10-CM | POA: Insufficient documentation

## 2021-07-27 DIAGNOSIS — Z125 Encounter for screening for malignant neoplasm of prostate: Secondary | ICD-10-CM

## 2021-07-27 DIAGNOSIS — I152 Hypertension secondary to endocrine disorders: Secondary | ICD-10-CM

## 2021-07-27 DIAGNOSIS — E1169 Type 2 diabetes mellitus with other specified complication: Secondary | ICD-10-CM

## 2021-07-27 DIAGNOSIS — H7393 Unspecified disorder of tympanic membrane, bilateral: Secondary | ICD-10-CM | POA: Insufficient documentation

## 2021-07-27 DIAGNOSIS — E1159 Type 2 diabetes mellitus with other circulatory complications: Secondary | ICD-10-CM

## 2021-07-27 LAB — HM DIABETES FOOT EXAM

## 2021-07-27 MED ORDER — ATORVASTATIN CALCIUM 10 MG PO TABS: 10.0000 mg | ORAL_TABLET | Freq: Every day | ORAL | 3 refills | Status: DC

## 2021-07-27 MED ORDER — DICLOFENAC SODIUM 75 MG PO TBEC: 75.0000 mg | DELAYED_RELEASE_TABLET | Freq: Two times a day (BID) | ORAL | 0 refills | Status: DC

## 2021-07-27 MED ORDER — AMLODIPINE BESYLATE 10 MG PO TABS: 10.0000 mg | ORAL_TABLET | Freq: Every day | ORAL | 3 refills | Status: DC

## 2021-07-27 MED ORDER — LOSARTAN POTASSIUM-HCTZ 100-25 MG PO TABS: 1.0000 | ORAL_TABLET | Freq: Every day | ORAL | 3 refills | Status: DC

## 2021-07-27 MED ORDER — METOPROLOL SUCCINATE ER 50 MG PO TB24: 50.0000 mg | ORAL_TABLET | Freq: Every day | ORAL | 3 refills | Status: DC

## 2021-07-27 NOTE — Assessment & Plan Note (Signed)
Discussed weight loss options and limitations.  Will plan start of GLP1 at next OV depending on coverage and DM control.

## 2021-07-27 NOTE — Assessment & Plan Note (Signed)
Due for re-eval. 

## 2021-07-27 NOTE — Assessment & Plan Note (Signed)
Poor control in office, pt reports compliance with meds but took mes just prior to arriving at office. Follow BP at home, bring in measurements.  close follow up.  On amlodipine 10 mg daily and losartan HCTZ 100/25 mg daily

## 2021-07-27 NOTE — Progress Notes (Signed)
Patient ID: Joseph Armstrong, male    DOB: 1960-11-27, 61 y.o.   MRN: [id number]  This visit was conducted in person.    CC: Chief Complaint  Patient presents with   Itching in ears   Knee Pain    Bilateral    Clearing throat all the time     Subjective:   HPI: Joseph Armstrong is a 61 y.o. male presenting on 07/27/2021 for multiple issues.  Last OV with me 03/2020, overdue for many preventative items. Will follow up for CPX.  Acute sinus issues:  clearing throat constantly, itching in ears, chronically decreased hearing.  No fever, no face pain  Hypertension: Poor control in office today... BP Readings from Last 3 Encounters:  07/27/21 (!) 156/90  06/17/20 (!) 170/94  04/23/20 132/90   On amlodipine 10 mg daily and losartan HCTZ 100/25 mg daily Using medication without problems or lightheadedness:  Chest pain with exertion: Edema: Short of breath: Average home BPs: Has not checked lately. Other issues:   Diabetes: due fo re-eval. Due for cholesterol check as well. Using medications without difficulties: Hypoglycemic episodes: Hyperglycemic episodes: Feet problems: none Blood Sugars averaging: last check of blood normal range. eye exam within last year:  Chronic knee pain, left > right. Previously was using diclofenac for pain and inflammation.  Has had  See Dr. Erlinda Hong: [facility]o at [facility].. recommended weight loss and viscosupplementation.  Dr. Lorelei Pont also recommended Visco-suppementations.   07/27/2019: X-ray : Findings:   There is significant tricompartmental osteoarthritis with severe medial compartmental joint space loss.  There is subchondral sclerosis and osteophytosis in multiple compartments.   He is now disabled. He is very inactive.  Diclofenac helped some in past.   Wt Readings from Last 3 Encounters:  06/17/20 (!) 382 lb 12 oz (173.6 kg)  04/23/20 (!) 384 lb 8 oz (174.4 kg)  10/02/19 (!) 368 lb 4 oz (167 kg)       Relevant past medical, surgical,  family and social history reviewed and updated as indicated. Interim medical history since our last visit reviewed. Allergies and medications reviewed and updated. Outpatient Medications Prior to Visit  Medication Sig Dispense Refill   amLODipine (NORVASC) 10 MG tablet TAKE 1 TABLET BY MOUTH ONCE DAILY FOR HIGH BLOOD PRESSURE 90 tablet 0   atorvastatin (LIPITOR) 10 MG tablet Take 1 tablet (10 mg total) by mouth daily. 90 tablet 3   diclofenac (VOLTAREN) 75 MG EC tablet Take 1 tablet (75 mg total) by mouth 2 (two) times daily. 30 tablet 0   losartan-hydrochlorothiazide (HYZAAR) 100-25 MG tablet Take 1 tablet by mouth once daily 90 tablet 0   metoprolol succinate (TOPROL-XL) 50 MG 24 hr tablet TAKE 1 TABLET BY MOUTH ONCE DAILY FOR HIGH BLOOD PRESSURE 90 tablet 0   neomycin-polymyxin-hydrocortisone (CORTISPORIN) 3.5-10000-1 OTIC suspension Place 4 drops into both ears 4 (four) times daily. 10 mL 0   nystatin cream (MYCOSTATIN) Apply 1 application topically 2 (two) times daily. 60 g 1   No facility-administered medications prior to visit.     Per HPI unless specifically indicated in ROS section below Review of Systems  Constitutional:  Positive for fatigue. Negative for fever.  HENT:  Positive for postnasal drip. Negative for ear pain and sinus pressure.   Eyes:  Negative for pain.  Respiratory:  Negative for cough and shortness of breath.   Cardiovascular:  Negative for chest pain, palpitations and leg swelling.  Gastrointestinal:  Negative for abdominal pain.  Genitourinary:  Negative  for dysuria.  Musculoskeletal:  Positive for arthralgias and gait problem.  Neurological:  Negative for syncope, light-headedness and headaches.  Psychiatric/Behavioral:  Negative for dysphoric mood.   Objective:  There were no vitals taken for this visit.  Wt Readings from Last 3 Encounters:  06/17/20 (!) 382 lb 12 oz (173.6 kg)  04/23/20 (!) 384 lb 8 oz (174.4 kg)  10/02/19 (!) 368 lb 4 oz (167 kg)       Physical Exam Constitutional:      Appearance: He is well-developed. He is obese.  HENT:     Head: Normocephalic.     Right Ear: Hearing normal. No decreased hearing noted. No tenderness. A middle ear effusion is present. Tympanic membrane is scarred. Tympanic membrane is not erythematous.     Left Ear: Hearing normal. No decreased hearing noted. No tenderness. A middle ear effusion is present. Tympanic membrane is scarred. Tympanic membrane is not erythematous.     Nose: Nose normal.  Neck:     Thyroid: No thyroid mass or thyromegaly.     Vascular: No carotid bruit.     Trachea: Trachea normal.  Cardiovascular:     Rate and Rhythm: Normal rate and regular rhythm.     Pulses: Normal pulses.     Heart sounds: Heart sounds not distant. No murmur heard.   No friction rub. No gallop.     Comments: No peripheral edema Pulmonary:     Effort: Pulmonary effort is normal. No respiratory distress.     Breath sounds: Normal breath sounds.  Skin:    General: Skin is warm and dry.     Findings: No rash.  Psychiatric:        Speech: Speech normal.        Behavior: Behavior normal.        Thought Content: Thought content normal.    Diabetic foot exam: Normal inspection No skin breakdown No calluses  Normal DP pulses Normal sensation to light touch and monofilament Nails normal     Results for orders placed or performed in visit on 04/23/20  HM DIABETES FOOT EXAM  Result Value Ref Range   HM Diabetic Foot Exam done     This visit occurred during the SARS-CoV-2 public health emergency.  Safety protocols were in place, including screening questions prior to the visit, additional usage of staff PPE, and extensive cleaning of exam room while observing appropriate contact time as indicated for disinfecting solutions.   COVID 19 screen:  No recent travel or known exposure to COVID19 The patient denies respiratory symptoms of COVID 19 at this time. The importance of social distancing was  discussed today.   Assessment and Plan Problem List Items Addressed This Visit     Bilateral primary osteoarthritis of knee (Chronic)    Restart diclofenac BID and work on weight loss. ONce weight loss achieved he will be better candidate for knee replacement.      Relevant Medications   diclofenac (VOLTAREN) 75 MG EC tablet   Hyperlipidemia associated with type 2 diabetes mellitus (HCC) - Primary (Chronic)    Due for re-eval.      Relevant Medications   metoprolol succinate (TOPROL-XL) 50 MG 24 hr tablet   losartan-hydrochlorothiazide (HYZAAR) 100-25 MG tablet   amLODipine (NORVASC) 10 MG tablet   atorvastatin (LIPITOR) 10 MG tablet   Hypertension associated with diabetes ([place]) (Chronic)    Poor control in office, pt reports compliance with meds but took mes just prior to arriving at office.  Follow BP at home, bring in measurements.  close follow up.  On amlodipine 10 mg daily and losartan HCTZ 100/25 mg daily      Relevant Medications   metoprolol succinate (TOPROL-XL) 50 MG 24 hr tablet   losartan-hydrochlorothiazide (HYZAAR) 100-25 MG tablet   amLODipine (NORVASC) 10 MG tablet   atorvastatin (LIPITOR) 10 MG tablet   Morbid obesity with BMI of 50.0-59.9, adult (HCC) (Chronic)    Discussed weight loss options and limitations.  Will plan start of GLP1 at next OV depending on coverage and DM control.      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      Tm (tympanic membrane disorder), bilateral    Chronic fluid  behind TMs, also possible scarring changes of TMs bialterally.  If not improving at follow up.. refer to ENT for further eval.      Other Visit Diagnoses     Essential hypertension, malignant       Relevant Medications   metoprolol succinate (TOPROL-XL) 50 MG 24 hr tablet   losartan-hydrochlorothiazide (HYZAAR) 100-25 MG tablet   amLODipine (NORVASC) 10 MG tablet   atorvastatin (LIPITOR) 10 MG tablet   Essential hypertension       Relevant Medications   metoprolol succinate (TOPROL-XL) 50 MG 24 hr tablet   losartan-hydrochlorothiazide (HYZAAR) 100-25 MG tablet   amLODipine (NORVASC) 10 MG tablet   atorvastatin (LIPITOR) 10 MG tablet   Prostate cancer screening       Relevant Orders   PSA          Eliezer Lofts, MD

## 2021-07-27 NOTE — Assessment & Plan Note (Signed)
Start flonase 2 spray bialterally given PND and chronic fluid in ears.

## 2021-07-27 NOTE — Patient Instructions (Addendum)
Set up yearly eye exam for diabetes and have the opthalmologist send [place] a copy of the evaluation for the chart.  Call to set up dental visit.  Please stop at the lab to have labs drawn.  Start flonase ( generic fluticasone) 2 sprays per nostril daily.  Follow BP at home.. bring in measurements of BP every other day... to next appt.  Increase activity and work on low carbohydrate diet.

## 2021-07-27 NOTE — Assessment & Plan Note (Signed)
Restart diclofenac BID and work on weight loss. ONce weight loss achieved he will be better candidate for knee replacement.

## 2021-07-27 NOTE — Assessment & Plan Note (Signed)
Chronic fluid behind TMs, also possible scarring changes of TMs bialterally.  If not improving at follow up.. refer to ENT for further eval.

## 2021-07-28 LAB — PSA: PSA: 0.61 ng/mL (ref 0.10–4.00)

## 2021-07-28 LAB — LIPID PANEL
Cholesterol: 180 mg/dL (ref 0–200)
HDL: 42.3 mg/dL (ref >39.00)
LDL Cholesterol: 117 mg/dL — ABNORMAL HIGH (ref 0–99)
NonHDL: 137.99
Total CHOL/HDL Ratio: 4
Triglycerides: 105 mg/dL (ref 0.0–149.0)
VLDL: 21 mg/dL (ref 0.0–40.0)

## 2021-07-28 LAB — COMPREHENSIVE METABOLIC PANEL
ALT: 18 U/L (ref 0–53)
AST: 17 U/L (ref 0–37)
Albumin: 4.1 g/dL (ref 3.5–5.2)
Alkaline Phosphatase: 52 U/L (ref 39–117)
BUN: 15 mg/dL (ref 6–23)
CO2: 30 mEq/L (ref 19–32)
Calcium: 9.4 mg/dL (ref 8.4–10.5)
Chloride: 102 mEq/L (ref 96–112)
Creatinine, Ser: 1.38 mg/dL (ref 0.40–1.50)
GFR: 55.44 mL/min — ABNORMAL LOW (ref >60.00)
Glucose, Bld: 87 mg/dL (ref 70–99)
Potassium: 4.4 mEq/L (ref 3.5–5.1)
Sodium: 140 mEq/L (ref 135–145)
Total Bilirubin: 0.4 mg/dL (ref 0.2–1.2)
Total Protein: 7.3 g/dL (ref 6.0–8.3)

## 2021-07-28 LAB — HEMOGLOBIN A1C: Hgb A1c MFr Bld: 6.1 % (ref 4.6–6.5)

## 2021-10-01 ENCOUNTER — Ambulatory Visit (INDEPENDENT_AMBULATORY_CARE_PROVIDER_SITE_OTHER): Payer: Medicaid - Out of State | Admitting: Family Medicine

## 2021-10-01 ENCOUNTER — Other Ambulatory Visit: Payer: Self-pay

## 2021-10-01 ENCOUNTER — Encounter: Payer: Self-pay | Admitting: Family Medicine

## 2021-10-01 VITALS — BP 156/82 | HR 72 | Ht 70.0 in | Wt 377.0 lb

## 2021-10-01 DIAGNOSIS — Z6841 Body Mass Index (BMI) 40.0 and over, adult: Secondary | ICD-10-CM

## 2021-10-01 DIAGNOSIS — H9193 Unspecified hearing loss, bilateral: Secondary | ICD-10-CM | POA: Diagnosis not present

## 2021-10-01 DIAGNOSIS — E1159 Type 2 diabetes mellitus with other circulatory complications: Secondary | ICD-10-CM

## 2021-10-01 DIAGNOSIS — Z01118 Encounter for examination of ears and hearing with other abnormal findings: Secondary | ICD-10-CM

## 2021-10-01 DIAGNOSIS — R0982 Postnasal drip: Secondary | ICD-10-CM

## 2021-10-01 DIAGNOSIS — E785 Hyperlipidemia, unspecified: Secondary | ICD-10-CM

## 2021-10-01 DIAGNOSIS — Z0000 Encounter for general adult medical examination without abnormal findings: Secondary | ICD-10-CM

## 2021-10-01 DIAGNOSIS — M170 Bilateral primary osteoarthritis of knee: Secondary | ICD-10-CM

## 2021-10-01 DIAGNOSIS — I152 Hypertension secondary to endocrine disorders: Secondary | ICD-10-CM

## 2021-10-01 DIAGNOSIS — E1169 Type 2 diabetes mellitus with other specified complication: Secondary | ICD-10-CM

## 2021-10-01 DIAGNOSIS — I10 Essential (primary) hypertension: Secondary | ICD-10-CM

## 2021-10-01 LAB — POCT GLYCOSYLATED HEMOGLOBIN (HGB A1C): Hemoglobin A1C: 6 % — AB (ref 4.0–5.6)

## 2021-10-01 MED ORDER — DICLOFENAC SODIUM 75 MG PO TBEC: 75.0000 mg | DELAYED_RELEASE_TABLET | Freq: Two times a day (BID) | ORAL | 0 refills | Status: DC

## 2021-10-01 MED ORDER — TRULICITY 0.75 MG/0.5ML ~~LOC~~ SOAJ: 0.7500 mg | SUBCUTANEOUS | 11 refills | Status: DC

## 2021-10-01 MED ORDER — METOPROLOL SUCCINATE ER 50 MG PO TB24: 50.0000 mg | ORAL_TABLET | Freq: Every day | ORAL | 3 refills | Status: DC

## 2021-10-01 MED ORDER — ATORVASTATIN CALCIUM 10 MG PO TABS: 10.0000 mg | ORAL_TABLET | Freq: Every day | ORAL | 3 refills | Status: DC

## 2021-10-01 MED ORDER — AMLODIPINE BESYLATE 10 MG PO TABS: 10.0000 mg | ORAL_TABLET | Freq: Every day | ORAL | 3 refills | Status: DC

## 2021-10-01 NOTE — Patient Instructions (Signed)
Follow BP at home.Marland Kitchen goal < 140/90... call in 1-2 weeks with BP measurement ?

## 2021-10-01 NOTE — Progress Notes (Incomplete)
Patient ID: Joseph Armstrong, male    DOB: 1960/08/11, 61 y.o.   MRN: [id number]  This visit was conducted in person.  BP (!) 158/82    Pulse 72    Ht '5\' 10"'$  (1.778 m)    Wt (!) 377 lb (171 kg)    SpO2 95%    BMI 54.09 kg/m    CC:  Chief Complaint  Patient presents with   Annual Exam    Physical, ear are icthy, clearing throat , no fever, having knee pain , using  medication for knee pain ,pt needs refills on medication    Subjective:   HPI: Joseph Armstrong is a 61 y.o. male presenting on 10/01/2021 for Annual Exam (Physical, ear are icthy, clearing throat , no fever, having knee pain , using  medication for knee pain ,pt needs refills on medication)  Acute sinus issues:  clearing throat constantly, itching in ears, chronically decreased hearing.  No fever, no face pain... ongoing for 6 months.  Last OV told to take  flonase.... tried OTC spay not sure nose for 1-2 weeks.  May have helped the throat. But ears continued to itch.  He wears headphone to sleep at night.  Diabetes:   Lab Results  Component Value Date   HGBA1C 6.0 (A) 10/01/2021  Using medications without difficulties: Hypoglycemic episodes: Hyperglycemic episodes: Feet problems: Blood Sugars averaging: eye exam within last year:  Elevated Cholesterol:  Atorvastatin 10 mg daily. Now back on this since 07/2021 labs Lab Results  Component Value Date   CHOL 180 07/27/2021   HDL 42.30 07/27/2021   LDLCALC 117 (H) 07/27/2021   TRIG 105.0 07/27/2021   CHOLHDL 4 07/27/2021  Using medications without problems: Muscle aches:  Diet compliance: Exercise: Other complaints:  Hypertension:    Elevated  here.. has not taken it yet today. BP Readings from Last 3 Encounters:  10/01/21 (!) 158/82  07/27/21 (!) 150/98  06/17/20 (!) 170/94  Using medication without problems or lightheadedness:  none Chest pain with exertion:none Edema: none Short of breath: yes Average home BPs: Other issues:  Morbid obesity:  He has  been working on weight loss > 6 months.  He has been drinking more water, decrease fried foods,  not eating late.  Considering joining weight watchers. Wt Readings from Last 3 Encounters:  10/01/21 (!) 377 lb (171 kg)  07/27/21 (!) 386 lb 4 oz (175.2 kg)  06/17/20 (!) 382 lb 12 oz (173.6 kg)   Body mass index is 54.09 kg/m.   OSA:  Bilateral knee OA: on diclofenac 75 mg BID.       Relevant past medical, surgical, family and social history reviewed and updated as indicated. Interim medical history since our last visit reviewed. Allergies and medications reviewed and updated. Outpatient Medications Prior to Visit  Medication Sig Dispense Refill   amLODipine (NORVASC) 10 MG tablet Take 1 tablet (10 mg total) by mouth daily. for high blood pressure 90 tablet 3   atorvastatin (LIPITOR) 10 MG tablet Take 1 tablet (10 mg total) by mouth daily. 90 tablet 3   diclofenac (VOLTAREN) 75 MG EC tablet Take 1 tablet (75 mg total) by mouth 2 (two) times daily. 30 tablet 0   losartan-hydrochlorothiazide (HYZAAR) 100-25 MG tablet Take 1 tablet by mouth daily. 90 tablet 3   metoprolol succinate (TOPROL-XL) 50 MG 24 hr tablet Take 1 tablet (50 mg total) by mouth daily. for high blood pressure 90 tablet 3   No facility-administered medications prior  to visit.     Per HPI unless specifically indicated in ROS section below Review of Systems Objective:  BP (!) 158/82    Pulse 72    Ht '5\' 10"'$  (1.778 m)    Wt (!) 377 lb (171 kg)    SpO2 95%    BMI 54.09 kg/m   Wt Readings from Last 3 Encounters:  10/01/21 (!) 377 lb (171 kg)  07/27/21 (!) 386 lb 4 oz (175.2 kg)  06/17/20 (!) 382 lb 12 oz (173.6 kg)      Physical Exam    Results for orders placed or performed in visit on 07/27/21  Hemoglobin A1c  Result Value Ref Range   Hgb A1c MFr Bld 6.1 4.6 - 6.5 %  Lipid panel  Result Value Ref Range   Cholesterol 180 0 - 200 mg/dL   Triglycerides 105.0 0.0 - 149.0 mg/dL   HDL 42.30 >39.00 mg/dL    VLDL 21.0 0.0 - 40.0 mg/dL   LDL Cholesterol 117 (H) 0 - 99 mg/dL   Total CHOL/HDL Ratio 4    NonHDL 137.99   Comprehensive metabolic panel  Result Value Ref Range   Sodium 140 135 - 145 mEq/L   Potassium 4.4 3.5 - 5.1 mEq/L   Chloride 102 96 - 112 mEq/L   CO2 30 19 - 32 mEq/L   Glucose, Bld 87 70 - 99 mg/dL   BUN 15 6 - 23 mg/dL   Creatinine, Ser 1.38 0.40 - 1.50 mg/dL   Total Bilirubin 0.4 0.2 - 1.2 mg/dL   Alkaline Phosphatase 52 39 - 117 U/L   AST 17 0 - 37 U/L   ALT 18 0 - 53 U/L   Total Protein 7.3 6.0 - 8.3 g/dL   Albumin 4.1 3.5 - 5.2 g/dL   GFR 55.44 (L) >60.00 mL/min   Calcium 9.4 8.4 - 10.5 mg/dL  PSA  Result Value Ref Range   PSA 0.61 0.10 - 4.00 ng/mL  HM DIABETES FOOT EXAM  Result Value Ref Range   HM Diabetic Foot Exam done     This visit occurred during the SARS-CoV-2 public health emergency.  Safety protocols were in place, including screening questions prior to the visit, additional usage of staff PPE, and extensive cleaning of exam room while observing appropriate contact time as indicated for disinfecting solutions.   COVID 19 screen:  No recent travel or known exposure to COVID19 The patient denies respiratory symptoms of COVID 19 at this time. The importance of social distancing was discussed today.   Assessment and Plan The patient's preventative maintenance and recommended screening tests for an annual wellness exam were reviewed in full today. Brought up to date unless services declined.  Counselled on the importance of diet, exercise, and its role in overall health and mortality. The patient's FH and SH was reviewed, including their home life, tobacco status, and drug and alcohol status.     Vaccines: uptodate td, consider shingrix Prostate Cancer Screen:  stable Lab Results  Component Value Date   PSA 0.61 07/27/2021   PSA 0.36 09/17/2019   PSA 0.46 03/02/2015  Colon Cancer Screen: 2016, repeat in 10 years      Smoking Status:  none ETOH/ drug use: none/none  Hep C: neg  HIV screen:  refused    Eliezer Lofts, MD

## 2021-10-22 NOTE — Assessment & Plan Note (Signed)
Chronic, poor control ? ?This is associated with multiple comorbidities.  I have encouraged him to work on lifestyle changes but he is significantly limited by his bilateral knee osteoarthritis.  He has tried more than 6 months of dietary changes without improvement in weight. ? ?We will start him on Trulicity 0.  Have them 5 mg weekly and have him follow-up in 3 weeks ?

## 2021-10-22 NOTE — Assessment & Plan Note (Signed)
Acute ? ?No clear sign of bacterial infection ?Continue Flonase 2 sprays per nostril daily ? ?

## 2021-10-22 NOTE — Assessment & Plan Note (Signed)
Chronic, poorly controlled in office today ? ?He reports he has not yet taken his medication.  He will take this when he gets home and call us with the blood pressure measurements. ? ?Continue amlodipine 10 mg p.o. daily,  losartan hydrochlorothiazide 10/25 1 tablet p.o. daily and metoprolol XL 50 mg p.o. daily ?

## 2021-10-22 NOTE — Assessment & Plan Note (Signed)
Chronic, inadequate control ? ?He has only been back on the atorvastatin 10 mg daily for 2 months.  We will give this more time and then recheck cholesterol levels.  His LDL goal is less than 100 ?

## 2021-11-22 LAB — HM DIABETES EYE EXAM

## 2021-11-26 ENCOUNTER — Encounter: Payer: Self-pay | Admitting: Family Medicine

## 2022-12-01 ENCOUNTER — Other Ambulatory Visit: Payer: Self-pay | Admitting: Family Medicine

## 2022-12-01 DIAGNOSIS — I10 Essential (primary) hypertension: Secondary | ICD-10-CM

## 2022-12-01 NOTE — Telephone Encounter (Signed)
Please call and schedule CPE with fasting labs prior for Dr. Ermalene Searing.  Please send back to me once scheduled to refill medication.

## 2022-12-02 NOTE — Telephone Encounter (Signed)
Tried calling patient, not able to lvm and no mychart setup.

## 2022-12-05 NOTE — Telephone Encounter (Signed)
Scheduled cpe for 12/28/22

## 2022-12-28 ENCOUNTER — Ambulatory Visit (INDEPENDENT_AMBULATORY_CARE_PROVIDER_SITE_OTHER): Payer: Medicaid Other | Admitting: Family Medicine

## 2022-12-28 ENCOUNTER — Encounter: Payer: Self-pay | Admitting: Family Medicine

## 2022-12-28 VITALS — BP 124/84 | HR 80 | Temp 97.2°F | Ht 70.0 in | Wt 379.0 lb

## 2022-12-28 DIAGNOSIS — M170 Bilateral primary osteoarthritis of knee: Secondary | ICD-10-CM | POA: Diagnosis not present

## 2022-12-28 DIAGNOSIS — Z0000 Encounter for general adult medical examination without abnormal findings: Secondary | ICD-10-CM

## 2022-12-28 DIAGNOSIS — E1159 Type 2 diabetes mellitus with other circulatory complications: Secondary | ICD-10-CM

## 2022-12-28 DIAGNOSIS — Z125 Encounter for screening for malignant neoplasm of prostate: Secondary | ICD-10-CM

## 2022-12-28 DIAGNOSIS — I152 Hypertension secondary to endocrine disorders: Secondary | ICD-10-CM

## 2022-12-28 DIAGNOSIS — B372 Candidiasis of skin and nail: Secondary | ICD-10-CM

## 2022-12-28 DIAGNOSIS — Z7985 Long-term (current) use of injectable non-insulin antidiabetic drugs: Secondary | ICD-10-CM

## 2022-12-28 DIAGNOSIS — Z6841 Body Mass Index (BMI) 40.0 and over, adult: Secondary | ICD-10-CM

## 2022-12-28 DIAGNOSIS — E785 Hyperlipidemia, unspecified: Secondary | ICD-10-CM

## 2022-12-28 DIAGNOSIS — E1169 Type 2 diabetes mellitus with other specified complication: Secondary | ICD-10-CM

## 2022-12-28 DIAGNOSIS — E538 Deficiency of other specified B group vitamins: Secondary | ICD-10-CM

## 2022-12-28 DIAGNOSIS — G4733 Obstructive sleep apnea (adult) (pediatric): Secondary | ICD-10-CM

## 2022-12-28 LAB — MICROALBUMIN / CREATININE URINE RATIO
Creatinine,U: 84.5 mg/dL
Microalb Creat Ratio: 0.9 mg/g (ref 0.0–30.0)
Microalb, Ur: 0.8 mg/dL (ref 0.0–1.9)

## 2022-12-28 LAB — HM DIABETES FOOT EXAM

## 2022-12-28 LAB — PSA: PSA: 0.4 ng/mL (ref 0.10–4.00)

## 2022-12-28 LAB — HEMOGLOBIN A1C: Hgb A1c MFr Bld: 6 % (ref 4.6–6.5)

## 2022-12-28 MED ORDER — NYSTATIN 100000 UNIT/GM EX POWD: 1.0000 | Freq: Three times a day (TID) | CUTANEOUS | 0 refills | Status: DC

## 2022-12-28 MED ORDER — DICLOFENAC SODIUM 75 MG PO TBEC: 75.0000 mg | DELAYED_RELEASE_TABLET | Freq: Two times a day (BID) | ORAL | 0 refills | Status: DC

## 2022-12-28 MED ORDER — TRULICITY 1.5 MG/0.5ML ~~LOC~~ SOAJ: 1.5000 mg | SUBCUTANEOUS | 1 refills | Status: DC

## 2022-12-28 MED ORDER — FLUCONAZOLE 150 MG PO TABS: 150.0000 mg | ORAL_TABLET | Freq: Once | ORAL | 0 refills | Status: AC

## 2022-12-28 NOTE — Progress Notes (Signed)
Patient ID: Joseph Armstrong, male    DOB: 03-12-1961, 62 y.o.   MRN: [id number]  This visit was conducted in person.  BP 124/84 (BP Location: Left Arm, Patient Position: Sitting, Cuff Size: Normal)   Pulse 80   Temp (!) 97.2 F (36.2 C) (Temporal)   Ht 5\' 10"  (1.778 m)   Wt (!) 379 lb (171.9 kg)   SpO2 96%   BMI 54.38 kg/m    CC:  Chief Complaint  Patient presents with   Annual Exam    Subjective:   HPI: Joseph Armstrong is a 62 y.o. male presenting on 12/28/2022 for Annual Exam   Diabetes: Well-controlled in past .. Due for re-eval.  Trulicity  0.75 mg weekly Lab Results  Component Value Date   HGBA1C 6.0 12/28/2022  Using medications without difficulties: Hypoglycemic episodes: Hyperglycemic episodes: Feet problems: Blood Sugars averaging: eye exam within last year:  Elevated Cholesterol:    Due for re-eval Atorvastatin 10 mg daily.  Lab Results  Component Value Date   CHOL 138 12/28/2022   HDL 48.10 12/28/2022   LDLCALC 80 12/28/2022   TRIG 52.0 12/28/2022   CHOLHDL 3 12/28/2022  Using medications without problems: Muscle aches:  Diet compliance: Exercise: Other complaints:  Hypertension:    At goal on amlodipine and toprol XL BP Readings from Last 3 Encounters:  12/28/22 124/84  10/01/21 (!) 156/82  07/27/21 (!) 150/98  Using medication without problems or lightheadedness:  none Chest pain with exertion:none Edema: none Short of breath: yes Average home BPs: Other issues:  Morbid obesity:  He has been working on weight loss > 6 months.  He has been drinking more water, decrease fried foods,  not eating late.  Considering joining weight watchers. Wt Readings from Last 3 Encounters:  12/28/22 (!) 379 lb (171.9 kg)  10/01/21 (!) 377 lb (171 kg)  07/27/21 (!) 386 lb 4 oz (175.2 kg)   Body mass index is 54.38 kg/m.   Bilateral knee OA: on diclofenac 75 mg BID.  Having yeast in skin folds.     Relevant past medical, surgical, family and social  history reviewed and updated as indicated. Interim medical history since our last visit reviewed. Allergies and medications reviewed and updated. Outpatient Medications Prior to Visit  Medication Sig Dispense Refill   amLODipine (NORVASC) 10 MG tablet TAKE 1 TABLET BY MOUTH ONCE DAILY FOR HIGH BLOOD PRESSURE 30 tablet 0   atorvastatin (LIPITOR) 10 MG tablet Take 1 tablet by mouth once daily 30 tablet 0   losartan-hydrochlorothiazide (HYZAAR) 100-25 MG tablet Take 1 tablet by mouth daily. 90 tablet 3   metoprolol succinate (TOPROL-XL) 50 MG 24 hr tablet TAKE 1 TABLET BY MOUTH ONCE DAILY FOR HIGH BLOOD PRESSURE 30 tablet 0   diclofenac (VOLTAREN) 75 MG EC tablet Take 1 tablet (75 mg total) by mouth 2 (two) times daily. 30 tablet 0   TRULICITY 0.75 MG/0.5ML SOPN INJET 0.75 MG INTO THE SKIN ONCE A WEEK 4 mL 0   No facility-administered medications prior to visit.     Per HPI unless specifically indicated in ROS section below Review of Systems  Constitutional:  Negative for fatigue and fever.  HENT:  Negative for ear pain.   Eyes:  Negative for pain.  Respiratory:  Negative for cough and shortness of breath.   Cardiovascular:  Negative for chest pain, palpitations and leg swelling.  Gastrointestinal:  Negative for abdominal pain.  Genitourinary:  Negative for dysuria.  Musculoskeletal:  Negative for  arthralgias.  Neurological:  Negative for syncope, light-headedness and headaches.  Psychiatric/Behavioral:  Negative for dysphoric mood.    Objective:  BP 124/84 (BP Location: Left Arm, Patient Position: Sitting, Cuff Size: Normal)   Pulse 80   Temp (!) 97.2 F (36.2 C) (Temporal)   Ht 5\' 10"  (1.778 m)   Wt (!) 379 lb (171.9 kg)   SpO2 96%   BMI 54.38 kg/m   Wt Readings from Last 3 Encounters:  12/28/22 (!) 379 lb (171.9 kg)  10/01/21 (!) 377 lb (171 kg)  07/27/21 (!) 386 lb 4 oz (175.2 kg)      Physical Exam Constitutional:      Appearance: He is well-developed.  HENT:      Head: Normocephalic.     Right Ear: Hearing normal.     Left Ear: Hearing normal.     Nose: Nose normal.  Neck:     Thyroid: No thyroid mass or thyromegaly.     Vascular: No carotid bruit.     Trachea: Trachea normal.  Cardiovascular:     Rate and Rhythm: Normal rate and regular rhythm.     Pulses: Normal pulses.     Heart sounds: Heart sounds not distant. No murmur heard.    No friction rub. No gallop.     Comments: No peripheral edema Pulmonary:     Effort: Pulmonary effort is normal. No respiratory distress.     Breath sounds: Normal breath sounds.  Skin:    General: Skin is warm and dry.     Findings: No rash.  Psychiatric:        Speech: Speech normal.        Behavior: Behavior normal.        Thought Content: Thought content normal.   Diabetic foot exam: Normal inspection No skin breakdown No calluses  Normal DP pulses Normal sensation to light touch and monofilament Nails normal     Results for orders placed or performed in visit on 12/28/22  Hemoglobin A1c  Result Value Ref Range   Hgb A1c MFr Bld 6.0 4.6 - 6.5 %  Lipid panel  Result Value Ref Range   Cholesterol 138 0 - 200 mg/dL   Triglycerides 16.1 0.0 - 149.0 mg/dL   HDL 09.60 >45.40 mg/dL   VLDL 98.1 0.0 - 19.1 mg/dL   LDL Cholesterol 80 0 - 99 mg/dL   Total CHOL/HDL Ratio 3    NonHDL 89.93   Comprehensive metabolic panel  Result Value Ref Range   Sodium 140 135 - 145 mEq/L   Potassium 4.1 3.5 - 5.1 mEq/L   Chloride 102 96 - 112 mEq/L   CO2 22 19 - 32 mEq/L   Glucose, Bld 92 70 - 99 mg/dL   BUN 18 6 - 23 mg/dL   Creatinine, Ser 4.78 0.40 - 1.50 mg/dL   Total Bilirubin 0.6 0.2 - 1.2 mg/dL   Alkaline Phosphatase 58 39 - 117 U/L   AST 20 0 - 37 U/L   ALT 20 0 - 53 U/L   Total Protein 7.5 6.0 - 8.3 g/dL   Albumin 4.2 3.5 - 5.2 g/dL   GFR 29.56 (L) >21.30 mL/min   Calcium 9.3 8.4 - 10.5 mg/dL  Microalbumin / creatinine urine ratio  Result Value Ref Range   Microalb, Ur 0.8 0.0 - 1.9 mg/dL    Creatinine,U 86.5 mg/dL   Microalb Creat Ratio 0.9 0.0 - 30.0 mg/g  PSA  Result Value Ref Range   PSA 0.40 0.10 -  4.00 ng/mL  HM DIABETES FOOT EXAM  Result Value Ref Range   HM Diabetic Foot Exam done     This visit occurred during the SARS-CoV-2 public health emergency.  Safety protocols were in place, including screening questions prior to the visit, additional usage of staff PPE, and extensive cleaning of exam room while observing appropriate contact time as indicated for disinfecting solutions.   COVID 19 screen:  No recent travel or known exposure to COVID19 The patient denies respiratory symptoms of COVID 19 at this time. The importance of social distancing was discussed today.   Assessment and Plan The patient's preventative maintenance and recommended screening tests for an annual wellness exam were reviewed in full today. Brought up to date unless services declined.  Counselled on the importance of diet, exercise, and its role in overall health and mortality. The patient's FH and SH was reviewed, including their home life, tobacco status, and drug and alcohol status.     Vaccines: uptodate td, consider shingrix Prostate Cancer Screen:  stable Lab Results  Component Value Date   PSA 0.40 12/28/2022   PSA 0.61 07/27/2021   PSA 0.36 09/17/2019  Colon Cancer Screen: 2016, repeat in 10 years      Smoking Status: none ETOH/ drug use: none/none  Hep C: neg  HIV screen:  refused   Problem List Items Addressed This Visit     Bilateral primary osteoarthritis of knee (Chronic)    Restart diclofenac BID and work on weight loss. ONce weight loss achieved he will be better candidate for knee replacement.      Relevant Medications   diclofenac (VOLTAREN) 75 MG EC tablet   Candidal intertrigo    Refilled topical cream. Can also use fluconazole as needed for severe flares.      Relevant Medications   nystatin (MYCOSTATIN/NYSTOP) powder   Hyperlipidemia associated with  type 2 diabetes mellitus (HCC) (Chronic)    Due for reevaluation on atorvastatin 10 mg daily      Relevant Medications   Dulaglutide (TRULICITY) 1.5 MG/0.5ML SOPN   Hypertension associated with diabetes (HCC) (Chronic)    Chronic, well-controlled   Continue amlodipine 10 mg p.o. daily,  losartan hydrochlorothiazide 10/25 1 tablet p.o. daily and metoprolol XL 50 mg p.o. daily      Relevant Medications   Dulaglutide (TRULICITY) 1.5 MG/0.5ML SOPN   Morbid obesity with BMI of 50.0-59.9, adult (HCC) (Chronic)    Encouraged exercise, weight loss, healthy eating habits.       Relevant Medications   Dulaglutide (TRULICITY) 1.5 MG/0.5ML SOPN   OSA (obstructive sleep apnea) (Chronic)    Recommended CPAP for sleep apnea.      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  Off metfomrin ( caused SE) Work on low carb, low cholesterol diet. Encouraged exercise, weight loss, healthy eating habits.       Relevant Medications   Dulaglutide (TRULICITY) 1.5 MG/0.5ML SOPN   Other Relevant Orders   Hemoglobin A1c (Completed)   Lipid panel (Completed)   Comprehensive metabolic panel (Completed)   Microalbumin / creatinine urine ratio (Completed)   Vitamin B12 deficiency    Due for reevaluation      Other Visit Diagnoses     Routine general medical examination at a health care facility    -  Primary   Prostate cancer screening       Relevant Orders   PSA (Completed)  Meds ordered this encounter  Medications   nystatin (MYCOSTATIN/NYSTOP) powder    Sig: Apply 1 Application topically 3 (three) times daily.    Dispense:  15 g    Refill:  0   fluconazole (DIFLUCAN) 150 MG tablet    Sig: Take 1 tablet (150 mg total) by mouth once for 1 dose.    Dispense:  10 tablet    Refill:  0   diclofenac (VOLTAREN) 75 MG EC tablet    Sig: Take 1 tablet (75 mg total) by mouth 2 (two) times daily.    Dispense:  30 tablet     Refill:  0   Dulaglutide (TRULICITY) 1.5 MG/0.5ML SOPN    Sig: Inject 1.5 mg into the skin once a week.    Dispense:  6 mL    Refill:  1     Kerby Nora, MD

## 2022-12-28 NOTE — Patient Instructions (Addendum)
Set up yearly eye exam for diabetes and have the opthalmologist send [place] a copy of the evaluation for the chart.  Consider shingrix vaccine.. check with pharmacy for coverage.

## 2022-12-29 LAB — COMPREHENSIVE METABOLIC PANEL
ALT: 20 U/L (ref 0–53)
AST: 20 U/L (ref 0–37)
Albumin: 4.2 g/dL (ref 3.5–5.2)
Alkaline Phosphatase: 58 U/L (ref 39–117)
BUN: 18 mg/dL (ref 6–23)
CO2: 22 mEq/L (ref 19–32)
Calcium: 9.3 mg/dL (ref 8.4–10.5)
Chloride: 102 mEq/L (ref 96–112)
Creatinine, Ser: 1.47 mg/dL (ref 0.40–1.50)
GFR: 50.88 mL/min — ABNORMAL LOW (ref >60.00)
Glucose, Bld: 92 mg/dL (ref 70–99)
Potassium: 4.1 mEq/L (ref 3.5–5.1)
Sodium: 140 mEq/L (ref 135–145)
Total Bilirubin: 0.6 mg/dL (ref 0.2–1.2)
Total Protein: 7.5 g/dL (ref 6.0–8.3)

## 2022-12-29 LAB — LIPID PANEL
Cholesterol: 138 mg/dL (ref 0–200)
HDL: 48.1 mg/dL (ref >39.00)
LDL Cholesterol: 80 mg/dL (ref 0–99)
NonHDL: 89.93
Total CHOL/HDL Ratio: 3
Triglycerides: 52 mg/dL (ref 0.0–149.0)
VLDL: 10.4 mg/dL (ref 0.0–40.0)

## 2022-12-30 NOTE — Assessment & Plan Note (Signed)
Encouraged exercise, weight loss, healthy eating habits. ? ?

## 2022-12-30 NOTE — Assessment & Plan Note (Signed)
Recommended CPAP for sleep apnea.

## 2022-12-30 NOTE — Assessment & Plan Note (Signed)
Chronic, well-controlled   Continue amlodipine 10 mg p.o. daily,  losartan hydrochlorothiazide 10/25 1 tablet p.o. daily and metoprolol XL 50 mg p.o. daily

## 2022-12-30 NOTE — Assessment & Plan Note (Addendum)
Refilled topical cream. Can also use fluconazole as needed for severe flares.

## 2022-12-30 NOTE — Assessment & Plan Note (Addendum)
Due for reevaluation  .  Off metfomrin ( caused SE) Work on low carb, low cholesterol diet. Encouraged exercise, weight loss, healthy eating habits.

## 2022-12-30 NOTE — Assessment & Plan Note (Signed)
Due for re-evaluation on atorvastatin 10 mg daily. 

## 2022-12-30 NOTE — Assessment & Plan Note (Signed)
Due for reevaluation 

## 2022-12-30 NOTE — Assessment & Plan Note (Signed)
Restart diclofenac BID and work on weight loss. °ONce weight loss achieved he will be better candidate for knee replacement. °

## 2023-01-09 ENCOUNTER — Telehealth: Payer: Self-pay | Admitting: Family Medicine

## 2023-01-09 DIAGNOSIS — R944 Abnormal results of kidney function studies: Secondary | ICD-10-CM

## 2023-01-09 NOTE — Telephone Encounter (Signed)
Spoke with Joseph Armstrong.  He received his lab work and is concerned about the GFR.  I advised him to increase his water intake but I would also send message to Dr. Ermalene Searing for any other recommendations that he can do to help improve that level.  Please advise.

## 2023-01-09 NOTE — Telephone Encounter (Signed)
Patient called in and stated that he had questions regarding his lab results.

## 2023-01-10 NOTE — Telephone Encounter (Signed)
That is what I would recommend as well.  Have him increase his water and we can recheck the kidney function and 2 weeks with a basic metabolic panel.

## 2023-01-11 NOTE — Telephone Encounter (Signed)
Mr. Stockhausen notified as instructed by telephone.  Lab appointment scheduled 01/25/2023 at 8:00 am to check BMET per Dr. Ermalene Searing.  Future orders in Epic.

## 2023-01-11 NOTE — Addendum Note (Signed)
Addended by: Damita Lack on: 01/11/2023 09:15 AM   Modules accepted: Orders

## 2023-01-17 ENCOUNTER — Encounter: Payer: Self-pay | Admitting: Family Medicine

## 2023-01-17 ENCOUNTER — Ambulatory Visit: Payer: Medicaid Other | Admitting: Family Medicine

## 2023-01-17 VITALS — BP 138/90 | HR 76 | Temp 97.0°F | Ht 70.0 in | Wt 385.5 lb

## 2023-01-17 DIAGNOSIS — M546 Pain in thoracic spine: Secondary | ICD-10-CM | POA: Insufficient documentation

## 2023-01-17 DIAGNOSIS — R944 Abnormal results of kidney function studies: Secondary | ICD-10-CM | POA: Diagnosis not present

## 2023-01-17 MED ORDER — CYCLOBENZAPRINE HCL 5 MG PO TABS: 5.0000 mg | ORAL_TABLET | Freq: Every evening | ORAL | 0 refills | Status: DC | PRN

## 2023-01-17 NOTE — Progress Notes (Signed)
Patient ID: Joseph Armstrong, male    DOB: Aug 29, 1960, 62 y.o.   MRN: [id number]  This visit was conducted in person.  BP (!) 138/90   Pulse 76   Temp (!) 97 F (36.1 C) (Temporal)   Ht 5\' 10"  (1.778 m)   Wt (!) 385 lb 8 oz (174.9 kg)   SpO2 95%   BMI 55.31 kg/m    CC:  Chief Complaint  Patient presents with   Back Pain    Upper Left of Back    Subjective:   HPI: Joseph Armstrong is a 62 y.o. male with history of morbid obesity, BMI 55 and bilateral osteoarthritis of the knees presenting on 01/17/2023 for Back Pain (Upper Left of Back)   New onset upper left back pain, ongoing for 1-2 months.  No proceeding falls,   Increased pain with moving backward in stretch.  No numbness, no weakness.  No  change with movement of neck.   He plays a lot of poker... when sitting 7-8 hours in same position causes increase pain.  Diclofenac helps some for  3-4 days.     No fever, no associated urine changes. Does have more gas passing.  No rash.  Relevant past medical, surgical, family and social history reviewed and updated as indicated. Interim medical history since our last visit reviewed. Allergies and medications reviewed and updated. Outpatient Medications Prior to Visit  Medication Sig Dispense Refill   amLODipine (NORVASC) 10 MG tablet TAKE 1 TABLET BY MOUTH ONCE DAILY FOR HIGH BLOOD PRESSURE 30 tablet 0   atorvastatin (LIPITOR) 10 MG tablet Take 1 tablet by mouth once daily 30 tablet 0   diclofenac (VOLTAREN) 75 MG EC tablet Take 1 tablet (75 mg total) by mouth 2 (two) times daily. 30 tablet 0   Dulaglutide (TRULICITY) 1.5 MG/0.5ML SOPN Inject 1.5 mg into the skin once a week. 6 mL 1   fluconazole (DIFLUCAN) 150 MG tablet Take 150 mg by mouth once.     losartan-hydrochlorothiazide (HYZAAR) 100-25 MG tablet Take 1 tablet by mouth daily. 90 tablet 3   metoprolol succinate (TOPROL-XL) 50 MG 24 hr tablet TAKE 1 TABLET BY MOUTH ONCE DAILY FOR HIGH BLOOD PRESSURE 30 tablet 0   nystatin  (MYCOSTATIN/NYSTOP) powder Apply 1 Application topically 3 (three) times daily. 15 g 0   No facility-administered medications prior to visit.     Per HPI unless specifically indicated in ROS section below Review of Systems  Constitutional:  Negative for fatigue and fever.  HENT:  Negative for ear pain.   Eyes:  Negative for pain.  Respiratory:  Negative for cough and shortness of breath.   Cardiovascular:  Negative for chest pain, palpitations and leg swelling.  Gastrointestinal:  Negative for abdominal pain.  Genitourinary:  Negative for dysuria.  Musculoskeletal:  Negative for arthralgias.  Neurological:  Negative for syncope, light-headedness and headaches.  Psychiatric/Behavioral:  Negative for dysphoric mood.    Objective:  BP (!) 138/90   Pulse 76   Temp (!) 97 F (36.1 C) (Temporal)   Ht 5\' 10"  (1.778 m)   Wt (!) 385 lb 8 oz (174.9 kg)   SpO2 95%   BMI 55.31 kg/m   Wt Readings from Last 3 Encounters:  01/17/23 (!) 385 lb 8 oz (174.9 kg)  12/28/22 (!) 379 lb (171.9 kg)  10/01/21 (!) 377 lb (171 kg)      Physical Exam Constitutional:      Appearance: He is well-developed. He is obese.  HENT:     Head: Normocephalic.     Right Ear: Hearing normal.     Left Ear: Hearing normal.     Nose: Nose normal.  Neck:     Thyroid: No thyroid mass or thyromegaly.     Vascular: No carotid bruit.     Trachea: Trachea normal.  Cardiovascular:     Rate and Rhythm: Normal rate and regular rhythm.     Pulses: Normal pulses.     Heart sounds: Heart sounds not distant. No murmur heard.    No friction rub. No gallop.     Comments: No peripheral edema Pulmonary:     Effort: Pulmonary effort is normal. No respiratory distress.     Breath sounds: Normal breath sounds.  Musculoskeletal:     Cervical back: Normal.     Thoracic back: Spasms and tenderness present. No bony tenderness. Decreased range of motion.     Comments: Tenderness to palpation left paraspinous muscle  Skin:     General: Skin is warm and dry.     Findings: No rash.  Psychiatric:        Speech: Speech normal.        Behavior: Behavior normal.        Thought Content: Thought content normal.       Results for orders placed or performed in visit on 12/28/22  Hemoglobin A1c  Result Value Ref Range   Hgb A1c MFr Bld 6.0 4.6 - 6.5 %  Lipid panel  Result Value Ref Range   Cholesterol 138 0 - 200 mg/dL   Triglycerides 62.1 0.0 - 149.0 mg/dL   HDL 30.86 >57.84 mg/dL   VLDL 69.6 0.0 - 29.5 mg/dL   LDL Cholesterol 80 0 - 99 mg/dL   Total CHOL/HDL Ratio 3    NonHDL 89.93   Comprehensive metabolic panel  Result Value Ref Range   Sodium 140 135 - 145 mEq/L   Potassium 4.1 3.5 - 5.1 mEq/L   Chloride 102 96 - 112 mEq/L   CO2 22 19 - 32 mEq/L   Glucose, Bld 92 70 - 99 mg/dL   BUN 18 6 - 23 mg/dL   Creatinine, Ser 2.84 0.40 - 1.50 mg/dL   Total Bilirubin 0.6 0.2 - 1.2 mg/dL   Alkaline Phosphatase 58 39 - 117 U/L   AST 20 0 - 37 U/L   ALT 20 0 - 53 U/L   Total Protein 7.5 6.0 - 8.3 g/dL   Albumin 4.2 3.5 - 5.2 g/dL   GFR 13.24 (L) >40.10 mL/min   Calcium 9.3 8.4 - 10.5 mg/dL  Microalbumin / creatinine urine ratio  Result Value Ref Range   Microalb, Ur 0.8 0.0 - 1.9 mg/dL   Creatinine,U 27.2 mg/dL   Microalb Creat Ratio 0.9 0.0 - 30.0 mg/g  PSA  Result Value Ref Range   PSA 0.40 0.10 - 4.00 ng/mL  HM DIABETES FOOT EXAM  Result Value Ref Range   HM Diabetic Foot Exam done     Assessment and Plan  Acute left-sided thoracic back pain Assessment & Plan: Acute, new onset is likely related to position issues with prolonged. No focal vertebral tenderness or indication thoracic spine. No clear association with GI tract or urinary tract.   Start on physical therapy, heating pad and muscle relaxant at night.  Discussed ways to stretch and change positions during prolonged poker games.  Return and ER precautions provided.   Decreased glomerular filtration rate (GFR) Assessment &  Plan: Mild,  fluctuating. Noted on labs done prior to starting diclofenac. This likely secondary to poor p.o. fluid intake but drinking water. Urine microalbumin is in the normal range.  . Stop diclofenac. Push fluids.  Check basic metabolic panel in 2 weeks.   Other orders -     Cyclobenzaprine HCl; Take 1-2 tablets (5-10 mg total) by mouth at bedtime as needed for muscle spasms.  Dispense: 30 tablet; Refill: 0    No follow-ups on file.   Kerby Nora, MD

## 2023-01-17 NOTE — Assessment & Plan Note (Addendum)
Acute, new onset is likely related to position issues with prolonged. No focal vertebral tenderness or indication thoracic spine. No clear association with GI tract or urinary tract.   Start on physical therapy, heating pad and muscle relaxant at night.  Discussed ways to stretch and change positions during prolonged poker games.  Return and ER precautions provided.

## 2023-01-17 NOTE — Assessment & Plan Note (Signed)
Mild, fluctuating. Noted on labs done prior to starting diclofenac. This likely secondary to poor p.o. fluid intake but drinking water. Urine microalbumin is in the normal range.  . Stop diclofenac. Push fluids.  Check basic metabolic panel in 2 weeks.

## 2023-01-25 ENCOUNTER — Other Ambulatory Visit: Payer: Medicaid - Out of State

## 2023-02-08 ENCOUNTER — Other Ambulatory Visit (INDEPENDENT_AMBULATORY_CARE_PROVIDER_SITE_OTHER): Payer: Medicaid - Out of State

## 2023-02-08 DIAGNOSIS — R944 Abnormal results of kidney function studies: Secondary | ICD-10-CM | POA: Diagnosis not present

## 2023-02-08 LAB — BASIC METABOLIC PANEL
BUN: 15 mg/dL (ref 6–23)
CO2: 25 mEq/L (ref 19–32)
Calcium: 9.8 mg/dL (ref 8.4–10.5)
Chloride: 105 mEq/L (ref 96–112)
Creatinine, Ser: 1.4 mg/dL (ref 0.40–1.50)
GFR: 53.9 mL/min — ABNORMAL LOW (ref >60.00)
Glucose, Bld: 102 mg/dL — ABNORMAL HIGH (ref 70–99)
Potassium: 4.2 mEq/L (ref 3.5–5.1)
Sodium: 138 mEq/L (ref 135–145)

## 2023-06-17 ENCOUNTER — Other Ambulatory Visit: Payer: Self-pay | Admitting: Family Medicine

## 2023-06-17 DIAGNOSIS — E1169 Type 2 diabetes mellitus with other specified complication: Secondary | ICD-10-CM

## 2023-06-17 DIAGNOSIS — I10 Essential (primary) hypertension: Secondary | ICD-10-CM

## 2023-06-19 NOTE — Telephone Encounter (Signed)
Patient has been scheduled

## 2023-06-19 NOTE — Telephone Encounter (Signed)
Please call and schedule diabetes follow up with Dr. Ermalene Searing around 06/29/23.

## 2023-06-29 ENCOUNTER — Ambulatory Visit: Payer: Medicaid Other | Admitting: Family Medicine

## 2023-07-06 ENCOUNTER — Ambulatory Visit: Payer: Self-pay | Admitting: Family Medicine

## 2023-07-13 ENCOUNTER — Ambulatory Visit: Payer: Medicaid Other | Admitting: Family Medicine

## 2023-07-25 ENCOUNTER — Telehealth: Payer: Self-pay

## 2023-07-25 ENCOUNTER — Ambulatory Visit (INDEPENDENT_AMBULATORY_CARE_PROVIDER_SITE_OTHER): Payer: Medicaid Other | Admitting: Family Medicine

## 2023-07-25 ENCOUNTER — Other Ambulatory Visit (HOSPITAL_COMMUNITY): Payer: Self-pay

## 2023-07-25 VITALS — BP 154/90 | HR 65 | Temp 97.2°F | Ht 70.0 in | Wt 383.5 lb

## 2023-07-25 DIAGNOSIS — Z6841 Body Mass Index (BMI) 40.0 and over, adult: Secondary | ICD-10-CM

## 2023-07-25 DIAGNOSIS — E1169 Type 2 diabetes mellitus with other specified complication: Secondary | ICD-10-CM | POA: Diagnosis not present

## 2023-07-25 DIAGNOSIS — I10 Essential (primary) hypertension: Secondary | ICD-10-CM

## 2023-07-25 DIAGNOSIS — Z7985 Long-term (current) use of injectable non-insulin antidiabetic drugs: Secondary | ICD-10-CM

## 2023-07-25 DIAGNOSIS — I152 Hypertension secondary to endocrine disorders: Secondary | ICD-10-CM

## 2023-07-25 DIAGNOSIS — E1159 Type 2 diabetes mellitus with other circulatory complications: Secondary | ICD-10-CM | POA: Diagnosis not present

## 2023-07-25 DIAGNOSIS — E785 Hyperlipidemia, unspecified: Secondary | ICD-10-CM

## 2023-07-25 LAB — BASIC METABOLIC PANEL
BUN: 13 mg/dL (ref 6–23)
CO2: 27 meq/L (ref 19–32)
Calcium: 9.3 mg/dL (ref 8.4–10.5)
Chloride: 104 meq/L (ref 96–112)
Creatinine, Ser: 1.26 mg/dL (ref 0.40–1.50)
GFR: 60.97 mL/min (ref >60.00)
Glucose, Bld: 104 mg/dL — ABNORMAL HIGH (ref 70–99)
Potassium: 4 meq/L (ref 3.5–5.1)
Sodium: 139 meq/L (ref 135–145)

## 2023-07-25 LAB — POCT GLYCOSYLATED HEMOGLOBIN (HGB A1C): Hemoglobin A1C: 6 % — AB (ref 4.0–5.6)

## 2023-07-25 MED ORDER — DICLOFENAC SODIUM 75 MG PO TBEC: 75.0000 mg | DELAYED_RELEASE_TABLET | Freq: Two times a day (BID) | ORAL | 0 refills | Status: AC

## 2023-07-25 MED ORDER — METOPROLOL SUCCINATE ER 50 MG PO TB24: 50.0000 mg | ORAL_TABLET | Freq: Every day | ORAL | 3 refills | Status: AC

## 2023-07-25 MED ORDER — LOSARTAN POTASSIUM-HCTZ 100-25 MG PO TABS: 1.0000 | ORAL_TABLET | Freq: Every day | ORAL | 3 refills | Status: AC

## 2023-07-25 MED ORDER — TRULICITY 1.5 MG/0.5ML ~~LOC~~ SOAJ: 1.5000 mg | SUBCUTANEOUS | 11 refills | Status: DC

## 2023-07-25 MED ORDER — CYCLOBENZAPRINE HCL 5 MG PO TABS: 5.0000 mg | ORAL_TABLET | Freq: Every evening | ORAL | 0 refills | Status: AC | PRN

## 2023-07-25 MED ORDER — NYSTATIN 100000 UNIT/GM EX POWD: 1.0000 | Freq: Three times a day (TID) | CUTANEOUS | 0 refills | Status: DC

## 2023-07-25 MED ORDER — ATORVASTATIN CALCIUM 10 MG PO TABS: 10.0000 mg | ORAL_TABLET | Freq: Every day | ORAL | 3 refills | Status: AC

## 2023-07-25 MED ORDER — AMLODIPINE BESYLATE 10 MG PO TABS: 10.0000 mg | ORAL_TABLET | Freq: Every day | ORAL | 3 refills | Status: AC

## 2023-07-25 NOTE — Telephone Encounter (Signed)
 Pharmacy Patient Advocate Encounter  Received notification from [facility] that Prior Authorization for TRULICITY 1.5MG  has been APPROVED from 07/25/23 to 07/24/24   PA #/Case ID/Reference #: [id number]

## 2023-07-25 NOTE — Patient Instructions (Addendum)
 Start back on Trulcity 1.5 mg   injection weekly.  Check blood pressure at home  daily.. call in 1-2 weeks with measurements, goal  BP < 140/90 ideally less than 130/80.   Move the amlodipine  to take in the morning, take the other at night.  Please stop at the lab to have labs drawn. At lab on way out to check kidney function.

## 2023-07-25 NOTE — Telephone Encounter (Signed)
 Pharmacy Patient Advocate Encounter   Received notification from CoverMyMeds that prior authorization for TRULICITY  1.5MG  is required/requested.   Insurance verification completed.   The patient is insured through [id number] .   Per test claim: PA required; PA submitted to above mentioned insurance via CoverMyMeds Key/confirmation #/EOC [id number] Status is pending

## 2023-07-25 NOTE — Assessment & Plan Note (Addendum)
 Encouraged exercise, weight loss, healthy eating habits.  Restart GLP1... trulicity  Encouraged compliance with this medication.  Discussed possible side effects as well as benefits.  Discussed possibly changing from Trulicity  to Mounjaro?ozempic for more weight loss but he would like to hold off on at this time.

## 2023-07-25 NOTE — Progress Notes (Signed)
 Patient ID: Shaarav Ripple, male    DOB: 02/14/1961, 62 y.o.   MRN: [id number]  This visit was conducted in person.  BP (!) 150/100 (BP Location: Left Arm, Patient Position: Sitting, Cuff Size: Large)   Pulse 65   Temp (!) 97.2 F (36.2 C) (Temporal)   Ht 5' 10 (1.778 m)   Wt (!) 383 lb 8 oz (174 kg)   SpO2 97%   BMI 55.03 kg/m    CC:  Chief Complaint  Patient presents with   Diabetes    Subjective:   HPI: Maxfield Gildersleeve is a 62 y.o. male presenting on 07/25/2023 for Diabetes   Diabetes: Well-controlled   Trulicity   1.5  mg weekly... he says he is not taking  much ( never took the higher dose).. no side effects. Lab Results  Component Value Date   HGBA1C 6.0 (A) 07/25/2023  Using medications without difficulties: Hypoglycemic episodes: Hyperglycemic episodes: Feet problems: Blood Sugars averaging: eye exam within last year:   Hypertension:    At goal  in past on amlodipine , losartan  hydrochlorothiazide  100/25 and toprol  XL BP Readings from Last 3 Encounters:  07/25/23 (!) 150/100  01/17/23 (!) 138/90  12/28/22 124/84  Using medication without problems or lightheadedness:  none Chest pain with exertion:none Edema: none Short of breath: yes Average home BPs: Other issues:  Morbid obesity:  He has been working on weight loss > 6 months.  He has been drinking more water, decrease fried foods,  not eating late.  Considering joining weight watchers. Wt Readings from Last 3 Encounters:  07/25/23 (!) 383 lb 8 oz (174 kg)  01/17/23 (!) 385 lb 8 oz (174.9 kg)  12/28/22 (!) 379 lb (171.9 kg)   Body mass index is 55.03 kg/m.   Bilateral knee OA: on diclofenac  75 mg.. trying       Relevant past medical, surgical, family and social history reviewed and updated as indicated. Interim medical history since our last visit reviewed. Allergies and medications reviewed and updated. Outpatient Medications Prior to Visit  Medication Sig Dispense Refill   amLODipine   (NORVASC ) 10 MG tablet TAKE 1 TABLET BY MOUTH ONCE DAILY FOR HIGH BLOOD PRESSURE 90 tablet 0   atorvastatin  (LIPITOR) 10 MG tablet Take 1 tablet by mouth once daily 90 tablet 0   cyclobenzaprine  (FLEXERIL ) 5 MG tablet Take 1-2 tablets (5-10 mg total) by mouth at bedtime as needed for muscle spasms. 30 tablet 0   diclofenac  (VOLTAREN ) 75 MG EC tablet Take 1 tablet (75 mg total) by mouth 2 (two) times daily. 30 tablet 0   Dulaglutide  (TRULICITY ) 1.5 MG/0.5ML SOPN Inject 1.5 mg into the skin once a week. 6 mL 1   losartan -hydrochlorothiazide  (HYZAAR) 100-25 MG tablet Take 1 tablet by mouth daily. 90 tablet 3   metoprolol  succinate (TOPROL -XL) 50 MG 24 hr tablet TAKE 1 TABLET BY MOUTH ONCE DAILY FOR HIGH BLOOD PRESSURE 90 tablet 0   nystatin  (MYCOSTATIN /NYSTOP ) powder Apply 1 Application topically 3 (three) times daily. 15 g 0   fluconazole  (DIFLUCAN ) 150 MG tablet Take 150 mg by mouth once.     No facility-administered medications prior to visit.     Per HPI unless specifically indicated in ROS section below Review of Systems  Constitutional:  Negative for fatigue and fever.  HENT:  Negative for ear pain.   Eyes:  Negative for pain.  Respiratory:  Negative for cough and shortness of breath.   Cardiovascular:  Negative for chest pain, palpitations and leg  swelling.  Gastrointestinal:  Negative for abdominal pain.  Genitourinary:  Negative for dysuria.  Musculoskeletal:  Negative for arthralgias.  Neurological:  Negative for syncope, light-headedness and headaches.  Psychiatric/Behavioral:  Negative for dysphoric mood.    Objective:  BP (!) 150/100 (BP Location: Left Arm, Patient Position: Sitting, Cuff Size: Large)   Pulse 65   Temp (!) 97.2 F (36.2 C) (Temporal)   Ht 5' 10 (1.778 m)   Wt (!) 383 lb 8 oz (174 kg)   SpO2 97%   BMI 55.03 kg/m   Wt Readings from Last 3 Encounters:  07/25/23 (!) 383 lb 8 oz (174 kg)  01/17/23 (!) 385 lb 8 oz (174.9 kg)  12/28/22 (!) 379 lb (171.9 kg)       Physical Exam Constitutional:      Appearance: He is well-developed.  HENT:     Head: Normocephalic.     Right Ear: Hearing normal.     Left Ear: Hearing normal.     Nose: Nose normal.  Neck:     Thyroid: No thyroid mass or thyromegaly.     Vascular: No carotid bruit.     Trachea: Trachea normal.  Cardiovascular:     Rate and Rhythm: Normal rate and regular rhythm.     Pulses: Normal pulses.     Heart sounds: Heart sounds not distant. No murmur heard.    No friction rub. No gallop.     Comments: No peripheral edema Pulmonary:     Effort: Pulmonary effort is normal. No respiratory distress.     Breath sounds: Normal breath sounds.  Skin:    General: Skin is warm and dry.     Findings: No rash.  Psychiatric:        Speech: Speech normal.        Behavior: Behavior normal.        Thought Content: Thought content normal.        Results for orders placed or performed in visit on 07/25/23  POCT glycosylated hemoglobin (Hb A1C)   Collection Time: 07/25/23  8:42 AM  Result Value Ref Range   Hemoglobin A1C 6.0 (A) 4.0 - 5.6 %   HbA1c POC (<> result, manual entry)     HbA1c, POC (prediabetic range)     HbA1c, POC (controlled diabetic range)      This visit occurred during the SARS-CoV-2 public health emergency.  Safety protocols were in place, including screening questions prior to the visit, additional usage of staff PPE, and extensive cleaning of exam room while observing appropriate contact time as indicated for disinfecting solutions.   COVID 19 screen:  No recent travel or known exposure to COVID19 The patient denies respiratory symptoms of COVID 19 at this time. The importance of social distancing was discussed today.   Assessment and Plan The patient's preventative maintenance and recommended screening tests for an annual wellness exam were reviewed in full today. Brought up to date unless services declined.  Counselled on the importance of diet, exercise, and  its role in overall health and mortality. The patient's FH and SH was reviewed, including their home life, tobacco status, and drug and alcohol status.     Vaccines: uptodate td, consider shingrix Prostate Cancer Screen:  stable Lab Results  Component Value Date   PSA 0.40 12/28/2022   PSA 0.61 07/27/2021   PSA 0.36 09/17/2019  Colon Cancer Screen: 2016, repeat in 10 years      Smoking Status: none ETOH/ drug use: none/none  Hep C: neg  HIV screen:  refused   Problem List Items Addressed This Visit     Hyperlipidemia associated with type 2 diabetes mellitus (HCC) (Chronic)   Relevant Medications   amLODipine  (NORVASC ) 10 MG tablet   atorvastatin  (LIPITOR) 10 MG tablet   Dulaglutide  (TRULICITY ) 1.5 MG/0.5ML SOAJ   losartan -hydrochlorothiazide  (HYZAAR) 100-25 MG tablet   metoprolol  succinate (TOPROL -XL) 50 MG 24 hr tablet   Hypertension associated with diabetes (HCC) (Chronic)   Chronic,  in adequate control, pt states he is taking his medication.  On recheck BP improved to  He will continue working on heart healthy diet and we will restart Trulicity  for weight management.  Reevaluate with labs today for secondary cause of elevation.   Continue amlodipine  10 mg p.o. daily ( move to daytime),  losartan  hydrochlorothiazide  10/25 1 tablet p.o. daily and metoprolol  XL 50 mg p.o. daily      Relevant Medications   amLODipine  (NORVASC ) 10 MG tablet   atorvastatin  (LIPITOR) 10 MG tablet   Dulaglutide  (TRULICITY ) 1.5 MG/0.5ML SOAJ   losartan -hydrochlorothiazide  (HYZAAR) 100-25 MG tablet   metoprolol  succinate (TOPROL -XL) 50 MG 24 hr tablet   Morbid obesity with BMI of 50.0-59.9, adult (HCC) (Chronic)   Encouraged exercise, weight loss, healthy eating habits.  Restart GLP1... trulicity  Encouraged compliance with this medication.  Discussed possible side effects as well as benefits.  Discussed possibly changing from Trulicity  to Mounjaro?ozempic for more weight loss but he would like  to hold off on at this time.      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  Diabetes is well-controlled at present, but given minimal weight management off Trulicity  will have him restart this.  Restart Trulicity  1.5 mg weekly    Off metfomrin ( caused SE) Work on low carb, low cholesterol diet. Encouraged exercise, weight loss, healthy eating habits.       Relevant Medications   amLODipine  (NORVASC ) 10 MG tablet   atorvastatin  (LIPITOR) 10 MG tablet   Dulaglutide  (TRULICITY ) 1.5 MG/0.5ML SOAJ   losartan -hydrochlorothiazide  (HYZAAR) 100-25 MG tablet   metoprolol  succinate (TOPROL -XL) 50 MG 24 hr tablet   Other Relevant Orders   POCT glycosylated hemoglobin (Hb A1C) (Completed)   Other Visit Diagnoses       Essential hypertension       Relevant Medications   amLODipine  (NORVASC ) 10 MG tablet   atorvastatin  (LIPITOR) 10 MG tablet   losartan -hydrochlorothiazide  (HYZAAR) 100-25 MG tablet   metoprolol  succinate (TOPROL -XL) 50 MG 24 hr tablet   Other Relevant Orders   Basic Metabolic Panel     Essential hypertension, malignant       Relevant Medications   amLODipine  (NORVASC ) 10 MG tablet   atorvastatin  (LIPITOR) 10 MG tablet   losartan -hydrochlorothiazide  (HYZAAR) 100-25 MG tablet   metoprolol  succinate (TOPROL -XL) 50 MG 24 hr tablet      Meds ordered this encounter  Medications   nystatin  (MYCOSTATIN /NYSTOP ) powder    Sig: Apply 1 Application topically 3 (three) times daily.    Dispense:  15 g    Refill:  0   amLODipine  (NORVASC ) 10 MG tablet    Sig: Take 1 tablet (10 mg total) by mouth daily. for high blood pressure    Dispense:  90 tablet    Refill:  3   atorvastatin  (LIPITOR) 10 MG tablet    Sig: Take 1 tablet (10 mg total)  by mouth daily.    Dispense:  90 tablet    Refill:  3   cyclobenzaprine  (FLEXERIL ) 5 MG tablet    Sig:  Take 1-2 tablets (5-10 mg total) by mouth at bedtime as needed for muscle spasms.    Dispense:  30 tablet    Refill:  0   diclofenac  (VOLTAREN ) 75 MG EC tablet    Sig: Take 1 tablet (75 mg total) by mouth 2 (two) times daily.    Dispense:  30 tablet    Refill:  0   Dulaglutide  (TRULICITY ) 1.5 MG/0.5ML SOAJ    Sig: Inject 1.5 mg into the skin once a week.    Dispense:  6 mL    Refill:  11   losartan -hydrochlorothiazide  (HYZAAR) 100-25 MG tablet    Sig: Take 1 tablet by mouth daily.    Dispense:  90 tablet    Refill:  3   metoprolol  succinate (TOPROL -XL) 50 MG 24 hr tablet    Sig: Take 1 tablet (50 mg total) by mouth daily. for high blood pressure    Dispense:  90 tablet    Refill:  3     Greig Ring, MD

## 2023-07-25 NOTE — Assessment & Plan Note (Addendum)
 Stable, chronic.  Diabetes is well-controlled at present, but given minimal weight management off Trulicity  will have him restart this.  Restart Trulicity  1.5 mg weekly    Off metfomrin ( caused SE) Work on low carb, low cholesterol diet. Encouraged exercise, weight loss, healthy eating habits.

## 2023-07-25 NOTE — Assessment & Plan Note (Addendum)
 Chronic,  in adequate control, pt states he is taking his medication.  On recheck BP improved to 154/90. He will continue working on heart healthy diet and we will restart Trulicity  for weight management.  Reevaluate with labs today for secondary cause of elevation.   Continue amlodipine  10 mg p.o. daily ( move to daytime),  losartan  hydrochlorothiazide  10/25 1 tablet p.o. daily and metoprolol  XL 50 mg p.o. daily

## 2023-07-27 ENCOUNTER — Telehealth: Payer: Self-pay | Admitting: Pharmacy Technician

## 2023-07-27 ENCOUNTER — Other Ambulatory Visit (HOSPITAL_COMMUNITY): Payer: Self-pay

## 2023-07-27 NOTE — Telephone Encounter (Signed)
 Pharmacy Patient Advocate Encounter  Received notification from  [employer]  that Prior Authorization for Trulicity  1.5 MG/0.5 ML PEN has been APPROVED from 07/25/2023 to 07/24/2024. Spoke to pharmacy to process.Copay is $0.00.    PA #/Case ID/Reference #: PA Case: [id number]    **Approval letter will be uploaded to the media tab.**

## 2023-10-05 ENCOUNTER — Ambulatory Visit: Payer: Self-pay | Admitting: Family Medicine

## 2023-10-05 NOTE — Telephone Encounter (Signed)
Noted and agree with disposition. 

## 2023-10-05 NOTE — Telephone Encounter (Addendum)
 Chief Complaint: back pain Symptoms: back pain Frequency: months Pertinent Negatives: Patient denies CP, radiating pain, numbness/weakness, SOB, hematuria, dysuria, abd pain, fever Disposition: [] ED /[] Urgent Care (no appt availability in office) / [x] Appointment(In office/virtual)/ []  [facility]/ [] Home Care/ [] Refused Recommended Disposition /[] [facility]/ []  Follow-up with PCP Additional Notes: Pt reports back pain ongoing for months. Pt endorses 8/10 pain and states it is worse with certain movements. Pt is still able to walk and perform normal activities. Pt has tried Tylenol which he states helped a bit. Denies any recent strenuous exercises or lifting. States he has been seen for this pain. Denies that pain radiates. Denies CP and SOB. Denies numbness/weakness, urinary symptoms, loss of bowel/bladder control. Per protocol pt scheduled tomorrow in office at 1200. Pt agreeable to that plan. RN advised pt he needs to go the ED if he develops CP, SOB, or if the pain radiates to his legs and he develops numbness/tingling. RN advised pt to call back for other worsening. Pt verbalized understanding.  Pt also reported itching in his leg. RN intended to ask pt more probing questions about his leg itching after discussing back pain. However, RN selected a disposition based on back pain and ended the call before returning to the topic of leg itching. RN called pt back to discuss leg itching but the call did not go through. RN made multiple attempts.  Copied from CRM [id number]. Topic: Clinical - Red Word Triage >> Oct 05, 2023  8:38 AM Gurney Maxin H wrote: [facility] that prompted transfer to Nurse Triage: Pain in upper middle of back if he moves a certain way, itching sensation on right leg. Reason for Disposition  [1] MODERATE back pain (e.g., interferes with normal activities) AND [2] present > 3 days  Answer Assessment - Initial Assessment Questions 1. ONSET: "When did the pain  begin?"      "Couple of months", "I came in one time and they did some tests, I don't think they found anything", "something like a pulled muscle" 2. LOCATION: "Where does it hurt?" (upper, mid or lower back)     Upper middle of back 3. SEVERITY: "How bad is the pain?"  (e.g., Scale 1-10; mild, moderate, or severe)   - MILD (1-3): Doesn't interfere with normal activities.    - MODERATE (4-7): Interferes with normal activities or awakens from sleep.    - SEVERE (8-10): Excruciating pain, unable to do any normal activities.      8/10 4. PATTERN: "Is the pain constant?" (e.g., yes, no; constant, intermittent)      "Pain is constant, but certain times of day I don't feel it", worse with certain movements 5. RADIATION: "Does the pain shoot into your legs or somewhere else?"     No, "just mainly in my back" 6. CAUSE:  "What do you think is causing the back pain?"      Not sure  7. BACK OVERUSE:  "Any recent lifting of heavy objects, strenuous work or exercise?"     No 8. MEDICINES: "What have you taken so far for the pain?" (e.g., nothing, acetaminophen, NSAIDS)     Aspirin, Tylenol yesterday (said it helped) 9. NEUROLOGIC SYMPTOMS: "Do you have any weakness, numbness, or problems with bowel/bladder control?"     None 10. OTHER SYMPTOMS: "Do you have any other symptoms?" (e.g., fever, abdomen pain, burning with urination, blood in urine)       Leg has been itching (been putting [place] [employer] on it),  no hematuria, no dysuria, "I go frequently because of my blood pressure medicine", no abdominal pain, no difficulty breathing  Protocols used: Back Pain-A-AH

## 2023-10-06 ENCOUNTER — Ambulatory Visit: Admitting: Family Medicine

## 2023-10-06 ENCOUNTER — Encounter: Payer: Self-pay | Admitting: Family Medicine

## 2023-10-06 ENCOUNTER — Ambulatory Visit (INDEPENDENT_AMBULATORY_CARE_PROVIDER_SITE_OTHER)
Admission: RE | Admit: 2023-10-06 | Discharge: 2023-10-06 | Disposition: A | Discharge status: Home / Self Care | Source: Ambulatory Visit | Attending: Family Medicine | Admitting: Family Medicine

## 2023-10-06 VITALS — BP 142/90 | HR 67 | Temp 97.4°F | Ht 70.0 in | Wt 373.2 lb

## 2023-10-06 DIAGNOSIS — M546 Pain in thoracic spine: Secondary | ICD-10-CM

## 2023-10-06 DIAGNOSIS — E1159 Type 2 diabetes mellitus with other circulatory complications: Secondary | ICD-10-CM | POA: Diagnosis not present

## 2023-10-06 MED ORDER — TRIAMCINOLONE ACETONIDE 0.1 % EX CREA: 1.0000 | TOPICAL_CREAM | Freq: Two times a day (BID) | CUTANEOUS | 0 refills | Status: AC

## 2023-10-06 MED ORDER — TRULICITY 3 MG/0.5ML ~~LOC~~ SOAJ: 3.0000 mg | SUBCUTANEOUS | 3 refills | Status: AC

## 2023-10-06 NOTE — Progress Notes (Signed)
 Patient ID: Joseph Armstrong, male    DOB: 1961-07-12, 63 y.o.   MRN: [id number]  This visit was conducted in person.  BP (!) 142/90 (BP Location: Left Arm, Patient Position: Sitting, Cuff Size: Large)   Pulse 67   Temp (!) 97.4 F (36.3 C) (Temporal)   Ht 5\' 10"  (1.778 m)   Wt (!) 373 lb 4 oz (169.3 kg)   SpO2 98%   BMI 53.56 kg/m    CC:  Chief Complaint  Patient presents with   Back Pain    Subjective:   HPI: Joseph Armstrong is a 63 y.o. male presenting on 10/06/2023 for Back Pain     Had similar pain in 12/2022.. left thoracic   back pain  Muscle relaxant helped.   New in last 2 weeks back pain has returned mid back  across middle  No radiation of pain.  Pain with lying on back at night when going to sleep.  Pain is mild to moderate not severe.   No recent falls.  No recent lifting or twisting.   No rash.  No numbness or weakness.  Has tried tylenol helped some but it came back.    Dry itching darker skin on right lower leg...   Applying alcohol and alcohol  Wt Readings from Last 3 Encounters:  10/06/23 (!) 373 lb 4 oz (169.3 kg)  07/25/23 (!) 383 lb 8 oz (174 kg)  01/17/23 (!) 385 lb 8 oz (174.9 kg)    Relevant past medical, surgical, family and social history reviewed and updated as indicated. Interim medical history since our last visit reviewed. Allergies and medications reviewed and updated. Outpatient Medications Prior to Visit  Medication Sig Dispense Refill   amLODipine (NORVASC) 10 MG tablet Take 1 tablet (10 mg total) by mouth daily. for high blood pressure 90 tablet 3   atorvastatin (LIPITOR) 10 MG tablet Take 1 tablet (10 mg total) by mouth daily. 90 tablet 3   cyclobenzaprine (FLEXERIL) 5 MG tablet Take 1-2 tablets (5-10 mg total) by mouth at bedtime as needed for muscle spasms. 30 tablet 0   diclofenac (VOLTAREN) 75 MG EC tablet Take 1 tablet (75 mg total) by mouth 2 (two) times daily. 30 tablet 0   losartan-hydrochlorothiazide (HYZAAR) 100-25 MG  tablet Take 1 tablet by mouth daily. 90 tablet 3   metoprolol succinate (TOPROL-XL) 50 MG 24 hr tablet Take 1 tablet (50 mg total) by mouth daily. for high blood pressure 90 tablet 3   nystatin (MYCOSTATIN/NYSTOP) powder Apply 1 Application topically 3 (three) times daily. 15 g 0   Dulaglutide (TRULICITY) 1.5 MG/0.5ML SOAJ Inject 1.5 mg into the skin once a week. 6 mL 11   No facility-administered medications prior to visit.     Per HPI unless specifically indicated in ROS section below Review of Systems  Constitutional:  Negative for fatigue and fever.  HENT:  Negative for ear pain.   Eyes:  Negative for pain.  Respiratory:  Negative for cough and shortness of breath.   Cardiovascular:  Negative for chest pain, palpitations and leg swelling.  Gastrointestinal:  Negative for abdominal pain.  Genitourinary:  Negative for dysuria.  Musculoskeletal:  Positive for back pain. Negative for arthralgias.  Neurological:  Negative for syncope, light-headedness and headaches.  Psychiatric/Behavioral:  Negative for dysphoric mood.    Objective:  BP (!) 142/90 (BP Location: Left Arm, Patient Position: Sitting, Cuff Size: Large)   Pulse 67   Temp (!) 97.4 F (36.3 C) (Temporal)  Ht 5\' 10"  (1.778 m)   Wt (!) 373 lb 4 oz (169.3 kg)   SpO2 98%   BMI 53.56 kg/m   Wt Readings from Last 3 Encounters:  10/06/23 (!) 373 lb 4 oz (169.3 kg)  07/25/23 (!) 383 lb 8 oz (174 kg)  01/17/23 (!) 385 lb 8 oz (174.9 kg)      Physical Exam Constitutional:      Appearance: He is well-developed. He is obese.  HENT:     Head: Normocephalic.     Right Ear: Hearing normal.     Left Ear: Hearing normal.     Nose: Nose normal.  Neck:     Thyroid: No thyroid mass or thyromegaly.     Vascular: No carotid bruit.     Trachea: Trachea normal.  Cardiovascular:     Rate and Rhythm: Normal rate and regular rhythm.     Pulses: Normal pulses.     Heart sounds: Heart sounds not distant. No murmur heard.    No  friction rub. No gallop.     Comments: No peripheral edema Pulmonary:     Effort: Pulmonary effort is normal. No respiratory distress.     Breath sounds: Normal breath sounds.  Musculoskeletal:     Thoracic back: Spasms and tenderness present. No bony tenderness. Decreased range of motion.     Lumbar back: Decreased range of motion.  Skin:    General: Skin is warm and dry.     Findings: No rash.  Psychiatric:        Speech: Speech normal.        Behavior: Behavior normal.        Thought Content: Thought content normal.       Results for orders placed or performed in visit on 07/25/23  POCT glycosylated hemoglobin (Hb A1C)   Collection Time: 07/25/23  8:42 AM  Result Value Ref Range   Hemoglobin A1C 6.0 (A) 4.0 - 5.6 %   HbA1c POC (<> result, manual entry)     HbA1c, POC (prediabetic range)     HbA1c, POC (controlled diabetic range)    Basic Metabolic Panel   Collection Time: 07/25/23  9:21 AM  Result Value Ref Range   Sodium 139 135 - 145 mEq/L   Potassium 4.0 3.5 - 5.1 mEq/L   Chloride 104 96 - 112 mEq/L   CO2 27 19 - 32 mEq/L   Glucose, Bld 104 (H) 70 - 99 mg/dL   BUN 13 6 - 23 mg/dL   Creatinine, Ser 1.61 0.40 - 1.50 mg/dL   GFR 09.60 >45.40 mL/min   Calcium 9.3 8.4 - 10.5 mg/dL    Assessment and Plan  Acute midline thoracic back pain Assessment & Plan: Acute, evaluate with thoracic spine film to evaluate for fracture versus degenerative disc disease/osteoarthritis. Most likely musculoskeletal strain.    Encouraged home physical therapy on the mid back.  Can use Tylenol extra strength twice daily as needed for pain. If not improving consider formal physical therapy Return and ER precautions provided  Orders: -     DG Thoracic Spine W/Swimmers; Future  Type 2 diabetes mellitus with other circulatory complications [facility]) Assessment & Plan: Chronic, well-controlled. Given minimal weight loss with Trulicity 1.5 mg weekly will increase to 3 mg weekly   Other  orders -     Triamcinolone Acetonide; Apply 1 Application topically 2 (two) times daily.  Dispense: 30 g; Refill: 0 -     Trulicity; Inject 3 mg as directed  once a week.  Dispense: 6 mL; Refill: 3    No follow-ups on file.   Kerby Nora, MD

## 2023-10-13 NOTE — Assessment & Plan Note (Addendum)
 Acute, evaluate with thoracic spine film to evaluate for fracture versus degenerative disc disease/osteoarthritis. Most likely musculoskeletal strain.    Encouraged home physical therapy on the mid back.  Can use Tylenol extra strength twice daily as needed for pain. If not improving consider formal physical therapy Return and ER precautions provided

## 2023-10-13 NOTE — Assessment & Plan Note (Signed)
 Chronic, well-controlled. Given minimal weight loss with Trulicity 1.5 mg weekly will increase to 3 mg weekly

## 2023-10-27 ENCOUNTER — Ambulatory Visit: Payer: Medicaid Other | Admitting: Family Medicine

## 2024-06-17 ENCOUNTER — Other Ambulatory Visit: Payer: Self-pay | Admitting: Family Medicine

## 2024-06-17 NOTE — Telephone Encounter (Signed)
 Last office visit 10/06/23 for back pain.  Last refilled 07/25/2023 for 15 g with no refills.  Next appt: No future appointments.
